# Patient Record
Sex: Female | Born: 1945 | Race: White | Hispanic: No | Smoking: Former smoker
Health system: Southern US, Community
[De-identification: ages and names within clinical notes are randomized; demographics above are authoritative.]

## PROBLEM LIST (undated history)

## (undated) DIAGNOSIS — J439 Emphysema, unspecified: Secondary | ICD-10-CM

## (undated) DIAGNOSIS — I2699 Other pulmonary embolism without acute cor pulmonale: Secondary | ICD-10-CM

## (undated) DIAGNOSIS — R319 Hematuria, unspecified: Secondary | ICD-10-CM

## (undated) DIAGNOSIS — M1990 Unspecified osteoarthritis, unspecified site: Secondary | ICD-10-CM

## (undated) DIAGNOSIS — I252 Old myocardial infarction: Secondary | ICD-10-CM

## (undated) DIAGNOSIS — Z972 Presence of dental prosthetic device (complete) (partial): Secondary | ICD-10-CM

## (undated) DIAGNOSIS — R0600 Dyspnea, unspecified: Secondary | ICD-10-CM

## (undated) DIAGNOSIS — I2510 Atherosclerotic heart disease of native coronary artery without angina pectoris: Secondary | ICD-10-CM

## (undated) DIAGNOSIS — N189 Chronic kidney disease, unspecified: Secondary | ICD-10-CM

## (undated) DIAGNOSIS — J410 Simple chronic bronchitis: Secondary | ICD-10-CM

## (undated) DIAGNOSIS — N200 Calculus of kidney: Secondary | ICD-10-CM

## (undated) DIAGNOSIS — R058 Other specified cough: Secondary | ICD-10-CM

## (undated) DIAGNOSIS — Z955 Presence of coronary angioplasty implant and graft: Secondary | ICD-10-CM

## (undated) DIAGNOSIS — G2581 Restless legs syndrome: Secondary | ICD-10-CM

## (undated) DIAGNOSIS — E119 Type 2 diabetes mellitus without complications: Secondary | ICD-10-CM

## (undated) DIAGNOSIS — K219 Gastro-esophageal reflux disease without esophagitis: Secondary | ICD-10-CM

## (undated) DIAGNOSIS — R0609 Other forms of dyspnea: Secondary | ICD-10-CM

## (undated) DIAGNOSIS — Z87442 Personal history of urinary calculi: Secondary | ICD-10-CM

## (undated) DIAGNOSIS — F0390 Unspecified dementia without behavioral disturbance: Secondary | ICD-10-CM

## (undated) DIAGNOSIS — Z8719 Personal history of other diseases of the digestive system: Secondary | ICD-10-CM

## (undated) DIAGNOSIS — E785 Hyperlipidemia, unspecified: Secondary | ICD-10-CM

## (undated) DIAGNOSIS — N201 Calculus of ureter: Secondary | ICD-10-CM

## (undated) DIAGNOSIS — R05 Cough: Secondary | ICD-10-CM

## (undated) DIAGNOSIS — Z9981 Dependence on supplemental oxygen: Secondary | ICD-10-CM

## (undated) HISTORY — DX: Gastro-esophageal reflux disease without esophagitis: K21.9

## (undated) HISTORY — PX: CHOLECYSTECTOMY: SHX55

## (undated) HISTORY — PX: THYROIDECTOMY, PARTIAL: SHX18

## (undated) HISTORY — DX: Hyperlipidemia, unspecified: E78.5

## (undated) HISTORY — PX: TOTAL ABDOMINAL HYSTERECTOMY W/ BILATERAL SALPINGOOPHORECTOMY: SHX83

## (undated) HISTORY — PX: CARDIAC CATHETERIZATION: SHX172

## (undated) HISTORY — PX: CARDIOVASCULAR STRESS TEST: SHX262

## (undated) HISTORY — DX: Atherosclerotic heart disease of native coronary artery without angina pectoris: I25.10

---

## 1993-07-16 HISTORY — PX: CORONARY ANGIOPLASTY WITH STENT PLACEMENT: SHX49

## 2001-11-11 ENCOUNTER — Ambulatory Visit (HOSPITAL_COMMUNITY): Admission: RE | Admit: 2001-11-11 | Discharge: 2001-11-11 | Payer: Self-pay | Admitting: Gastroenterology

## 2005-03-20 ENCOUNTER — Ambulatory Visit: Payer: Self-pay | Admitting: Cardiology

## 2005-05-21 ENCOUNTER — Ambulatory Visit: Payer: Self-pay | Admitting: Cardiology

## 2006-03-09 ENCOUNTER — Inpatient Hospital Stay (HOSPITAL_COMMUNITY): Admission: EM | Admit: 2006-03-09 | Discharge: 2006-03-11 | Payer: Self-pay | Admitting: Cardiology

## 2006-03-09 ENCOUNTER — Ambulatory Visit: Payer: Self-pay | Admitting: Cardiology

## 2006-03-19 ENCOUNTER — Ambulatory Visit: Payer: Self-pay | Admitting: Cardiology

## 2006-10-31 ENCOUNTER — Ambulatory Visit: Payer: Self-pay | Admitting: Cardiology

## 2007-10-20 ENCOUNTER — Ambulatory Visit: Payer: Self-pay | Admitting: Cardiology

## 2008-09-07 ENCOUNTER — Inpatient Hospital Stay (HOSPITAL_COMMUNITY): Admission: AD | Admit: 2008-09-07 | Discharge: 2008-09-09 | Payer: Self-pay | Admitting: Cardiology

## 2008-09-07 ENCOUNTER — Ambulatory Visit: Payer: Self-pay | Admitting: Internal Medicine

## 2008-11-04 DIAGNOSIS — M940 Chondrocostal junction syndrome [Tietze]: Secondary | ICD-10-CM | POA: Insufficient documentation

## 2008-11-04 DIAGNOSIS — K219 Gastro-esophageal reflux disease without esophagitis: Secondary | ICD-10-CM | POA: Insufficient documentation

## 2008-11-04 DIAGNOSIS — E785 Hyperlipidemia, unspecified: Secondary | ICD-10-CM

## 2008-11-04 DIAGNOSIS — J449 Chronic obstructive pulmonary disease, unspecified: Secondary | ICD-10-CM

## 2008-11-05 ENCOUNTER — Encounter: Payer: Self-pay | Admitting: Cardiology

## 2008-11-05 ENCOUNTER — Ambulatory Visit: Payer: Self-pay | Admitting: Cardiology

## 2008-11-05 DIAGNOSIS — I2510 Atherosclerotic heart disease of native coronary artery without angina pectoris: Secondary | ICD-10-CM | POA: Insufficient documentation

## 2008-11-15 ENCOUNTER — Telehealth: Payer: Self-pay | Admitting: Cardiology

## 2009-09-27 ENCOUNTER — Ambulatory Visit: Payer: Self-pay | Admitting: Cardiology

## 2009-09-27 DIAGNOSIS — F17200 Nicotine dependence, unspecified, uncomplicated: Secondary | ICD-10-CM

## 2009-09-30 ENCOUNTER — Telehealth: Payer: Self-pay | Admitting: Cardiology

## 2010-03-24 ENCOUNTER — Ambulatory Visit: Payer: Self-pay | Admitting: Cardiology

## 2010-05-08 ENCOUNTER — Ambulatory Visit: Payer: Self-pay | Admitting: Cardiology

## 2010-05-11 ENCOUNTER — Ambulatory Visit: Payer: Self-pay | Admitting: Internal Medicine

## 2010-05-11 DIAGNOSIS — R0989 Other specified symptoms and signs involving the circulatory and respiratory systems: Secondary | ICD-10-CM | POA: Insufficient documentation

## 2010-05-11 DIAGNOSIS — R0609 Other forms of dyspnea: Secondary | ICD-10-CM

## 2010-05-11 LAB — CONVERTED CEMR LAB
Albumin: 3.8 g/dL (ref 3.5–5.2)
Bilirubin, Direct: 0.1 mg/dL (ref 0.0–0.3)
Cholesterol: 173 mg/dL (ref 0–200)
HDL: 44.8 mg/dL (ref >39.00)
Total CHOL/HDL Ratio: 4
Total Protein: 6.6 g/dL (ref 6.0–8.3)
Triglycerides: 84 mg/dL (ref 0.0–149.0)

## 2010-05-12 ENCOUNTER — Telehealth (INDEPENDENT_AMBULATORY_CARE_PROVIDER_SITE_OTHER): Payer: Self-pay | Admitting: *Deleted

## 2010-05-16 LAB — CONVERTED CEMR LAB
BUN: 10 mg/dL (ref 6–23)
Basophils Absolute: 0 10*3/uL (ref 0.0–0.1)
CO2: 27 meq/L (ref 19–32)
Calcium: 9.6 mg/dL (ref 8.4–10.5)
Chloride: 102 meq/L (ref 96–112)
Creatinine, Ser: 0.8 mg/dL (ref 0.4–1.2)
Eosinophils Absolute: 0.1 10*3/uL (ref 0.0–0.7)
Glucose, Bld: 92 mg/dL (ref 70–99)
Lymphocytes Relative: 25.7 % (ref 12.0–46.0)
MCHC: 33.8 g/dL (ref 30.0–36.0)
MCV: 92.6 fL (ref 78.0–100.0)
Monocytes Absolute: 1 10*3/uL (ref 0.1–1.0)
Neutrophils Relative %: 62.8 % (ref 43.0–77.0)
Pro B Natriuretic peptide (BNP): 97.1 pg/mL (ref 0.0–100.0)
RDW: 14.2 % (ref 11.5–14.6)

## 2010-06-12 ENCOUNTER — Ambulatory Visit: Payer: Self-pay | Admitting: Internal Medicine

## 2010-06-28 ENCOUNTER — Ambulatory Visit: Payer: Self-pay | Admitting: Cardiology

## 2010-07-07 LAB — CONVERTED CEMR LAB
ALT: 15 units/L (ref 0–35)
Albumin: 3.7 g/dL (ref 3.5–5.2)
Cholesterol: 112 mg/dL (ref 0–200)
LDL Cholesterol: 54 mg/dL (ref 0–99)
Total Protein: 6.5 g/dL (ref 6.0–8.3)
Triglycerides: 105 mg/dL (ref 0.0–149.0)
VLDL: 21 mg/dL (ref 0.0–40.0)

## 2010-08-13 LAB — CONVERTED CEMR LAB
ALT: 16 units/L (ref 0–35)
ALT: 17 units/L (ref 0–35)
ALT: 17 units/L (ref 0–35)
AST: 20 units/L (ref 0–37)
AST: 22 units/L (ref 0–37)
BUN: 11 mg/dL (ref 6–23)
BUN: 6 mg/dL (ref 6–23)
Bilirubin, Direct: 0.1 mg/dL (ref 0.0–0.3)
Bilirubin, Direct: 0.1 mg/dL (ref 0.0–0.3)
CO2: 30 meq/L (ref 19–32)
Calcium: 9.2 mg/dL (ref 8.4–10.5)
Calcium: 9.2 mg/dL (ref 8.4–10.5)
Cholesterol: 114 mg/dL (ref 0–200)
Cholesterol: 125 mg/dL (ref 0–200)
Creatinine, Ser: 0.9 mg/dL (ref 0.4–1.2)
GFR calc non Af Amer: 66.86 mL/min (ref >60)
GFR calc non Af Amer: 67.05 mL/min (ref >60)
Glucose, Bld: 95 mg/dL (ref 70–99)
HDL: 35.8 mg/dL — ABNORMAL LOW (ref >39.0)
HDL: 38.7 mg/dL — ABNORMAL LOW (ref >39.00)
Potassium: 3.9 meq/L (ref 3.5–5.1)
Potassium: 4.2 meq/L (ref 3.5–5.1)
Sodium: 144 meq/L (ref 135–145)
Sodium: 146 meq/L — ABNORMAL HIGH (ref 135–145)
Total CHOL/HDL Ratio: 3.2
Total Protein: 6.4 g/dL (ref 6.0–8.3)
Total Protein: 6.6 g/dL (ref 6.0–8.3)
Total Protein: 6.8 g/dL (ref 6.0–8.3)
Triglycerides: 75 mg/dL (ref 0–149)
VLDL: 21.8 mg/dL (ref 0.0–40.0)
VLDL: 35.2 mg/dL (ref 0.0–40.0)

## 2010-08-17 NOTE — Assessment & Plan Note (Signed)
Summary: Pulmonary/ new pt eval > Hfa 50% p coaching    Visit Type:  Initial Consult Copy to:  Self Primary Provider/Referring Provider:  Arrie Aran MD  CC:  Dyspnea.  History of Present Illness: 65 Gilbert active smoker with doe x 2005  May 11, 2010  1st pulmonary office eval cc doe indolent onset doe progressive better with proaire or a shot of kenalog.  Also cough esp when lie down, thick white mucus, some worse in winter and with colds.  Pt denies any significant sore throat, dysphagia, itching, sneezing,  nasal congestion or excess secretions,  fever, chills, sweats, unintended wt loss, pleuritic or exertional cp, hempoptysis, fluctuation in activity tolerance,  orthopnea pnd or leg swelling Pt also denies any obvious fluctuation in symptoms with weather or environmental change or other alleviating or aggravating factors.       Current Medications (verified): 1)  Isosorbide Mononitrate Cr 120 Mg Xr24h-Tab (Isosorbide Mononitrate) .Marland Kitchen.. 1 Tab Once Daily 2)  Lipitor 40 Mg Tabs (Atorvastatin Calcium) .... Take 1 Tablet At Bedtime. 3)  Nitroglycerin 0.4 Mg Subl (Nitroglycerin) .... Place 1 Tablet Under Tongue As Directed 4)  Aspirin Ec 325 Mg Tbec (Aspirin) .... Take One Tablet By Mouth Daily 5)  Omeprazole 20 Mg Cpdr (Omeprazole) .... 2 Caps Two Times A Day 6)  Proair Hfa 108 (90 Base) Mcg/act Aers (Albuterol Sulfate) .... Use As Directed As Needed 7)  Hydrocodone-Acetaminophen 5-500 Mg Tabs (Hydrocodone-Acetaminophen) .Marland Kitchen.. 1 Every 6 Hrs As Needed  Allergies (verified): No Known Drug Allergies  Past History:  Past Medical History: CORONARY ATHEROSCLEROSIS, NATIVE VESSEL (ICD-414.01) HYPERLIPIDEMIA-MIXED (ICD-272.4) COSTOCHONDRITIS (ICD-733.6) COPD (ICD-496)     - HFA 50% May 11, 2010      - PFTs ordered >>> GERD (ICD-530.81)  Family History: Family History of Coronary Artery Disease:  Family History of CVA or Stroke:  Negative for respiratory diseases or atopy    Social History: Retired  Widowed  Current smoker since age 65- smokes 1/2 ppd. No ETOH  Review of Systems       The patient complains of shortness of breath with activity, shortness of breath at rest, productive cough, chest pain, abdominal pain, and sneezing.  The patient denies non-productive cough, coughing up blood, irregular heartbeats, acid heartburn, indigestion, loss of appetite, weight change, difficulty swallowing, sore throat, tooth/dental problems, headaches, nasal congestion/difficulty breathing through nose, itching, ear ache, anxiety, depression, hand/feet swelling, joint stiffness or pain, rash, change in color of mucus, and fever.    Vital Signs:  Patient profile:   65 year old female Weight:      145 pounds O2 Sat:      97 % on Room air Temp:     98.7 degrees F oral Pulse rate:   87 / minute BP sitting:   134 / 78  (left arm)  Vitals Entered By: Vernie Murders (May 11, 2010 9:13 AM)  O2 Flow:  Room air  Physical Exam  Additional Exam:  edentulous pale amb wf nad wt 144 > 145 May 11, 2010 HEENT mild turbinate edema.  Oropharynx no thrush or excess pnd or cobblestoning.  No JVD or cervical adenopathy. Mild accessory muscle hypertrophy. Trachea midline, nl thryroid. Chest was hyperinflated by percussion with diminished breath sounds and moderate increased exp time without wheeze. Hoover sign positive at mid inspiration. Regular rate and rhythm without murmur gallop or rub or increase P2 or edema.  Abd: no hsm, nl excursion. Ext warm without cyanosis or clubbing.  Sodium                    138 mEq/L                   135-145   Potassium                 4.5 mEq/L                   3.5-5.1   Chloride                  102 mEq/L                   96-112   Carbon Dioxide            27 mEq/L                    19-32   Glucose                   Vanessa mg/dL                    16-10   BUN                       10 mg/dL                    9-60   Creatinine                 0.8 mg/dL                   4.5-4.0   Calcium                   9.6 mg/dL                   9.8-11.9   GFR                       78.71 mL/min                >60  Tests: (2) CBC Platelet w/Diff (CBCD)   White Cell Count          9.5 K/uL                    4.5-10.5   Red Cell Count            5.10 Mil/uL                 3.87-5.11   Hemoglobin           [H]  16.0 g/dL                   14.7-82.9   Hematocrit           [H]  47.2 %                      36.0-46.0   MCV                       Vanessa.6 fl                     78.0-100.0   MCHC                      33.8  g/dL                   08.6-57.8   RDW                       14.2 %                      11.5-14.6   Platelet Count            184.0 K/uL                  150.0-400.0   Neutrophil %              62.8 %                      43.0-77.0   Lymphocyte %              25.7 %                      12.0-46.0   Monocyte %                10.0 %                      3.0-12.0   Eosinophils%              1.2 %                       0.0-5.0   Basophils %               0.3 %                       0.0-3.0   Neutrophill Absolute      5.9 K/uL                    1.4-7.7   Lymphocyte Absolute       2.4 K/uL                    0.7-4.0   Monocyte Absolute         1.0 K/uL                    0.1-1.0  Eosinophils, Absolute                             0.1 K/uL                    0.0-0.7   Basophils Absolute        0.0 K/uL                    0.0-0.1  Tests: (3) TSH (TSH)   FastTSH                   0.49 uIU/mL                 0.35-5.50  Tests: (4) B-Type Natiuretic Peptide (BNPR)  B-Type Natriuetic Peptide                             97.1 pg/mL                  0.0-100.0  CXR  Procedure date:  05/11/2010  Findings:      Emphysematous changes. No acute abnormalities.  Impression & Recommendations:  Problem # 1:  COPD (ICD-496) Moderate clinically with a sign response to b2 and saba    DDX of  difficult airways managment all start with A and   include Adherence, Ace Inhibitors, Acid Reflux, Active Sinus Disease, Alpha 1 Antitripsin deficiency, Anxiety masquerading as Airways dz,  ABPA,  allergy(esp in young), Aspiration (esp in elderly), Adverse effects of DPI,  Active smokers, plus one B  = Beta blocker use..   In this case Adherence is the biggest issue and starts with  inability to use HFA effectively and also  understand that SABA treats the symptoms but doesn't get to the underlying problem (inflammation).  I used  the ananology of putting steroid cream on a rash to help explain the meaning of topical therapy and the need to get the drug to the target tissue.    I spent extra time with the patient today explaining optimal mdi  technique.  This improved from  25-50%  Active smoking the other obvious issue  ? Acid reflux also an issue: rx empirically  Each maintenance medication was reviewed in detail including most importantly the difference between maintenance prns and under what circumstances the prns are to be used.  In addition, these two groups (for which the patient should keep up with refills) were distinguished from a third group :  meds that are used only short term with the intent to complete a course of therapy and then not refill them.  The med list was then fully reconciled and reorganized to reflect this important distinction.   Problem # 2:  TOBACCO USER (ICD-305.1)  I took this opportunity to educate the patient regarding the consequences of smoking in airway disorders based on all the data we have from the multiple national lung health studies indicating that smoking cessation, not choice of inhalers or physicians, is the most important aspect of care.    I emphasized that although we never turn away smokers from the pulmonary clinic, we do ask that they understand that the recommendations that were made won't work nearly as well in the presence of continued cigarette exposure and we may reach a point where we can't  help the patient if he/she can't quit smoking.    Orders: New Patient Level V ([address])  Medications Added to Medication List This Visit: 1)  Omeprazole 20 Mg Cpdr (Omeprazole) .... Take one 30-60 min before first and last meals of the day 2)  Pepcid 20 Mg Tabs (Famotidine) .... Take one by mouth at bedtime 3)  Symbicort 160-4.5 Mcg/act Aero (Budesonide-formoterol fumarate) .... 2 puffs first thing  in am and 2 puffs again in pm about 12 hours later 4)  Hydrocodone-acetaminophen 5-500 Mg Tabs (Hydrocodone-acetaminophen) .Marland Kitchen.. 1 every 6 hrs as needed 5)  Tramadol Hcl 50 Mg Tabs (Tramadol hcl) .... One to two by mouth every 4-6 hours  Other Orders: T-2 View CXR (71020TC) TLB-BMP (Basic Metabolic Panel-BMET) (80048-METABOL) TLB-CBC Platelet - w/Differential (85025-CBCD) TLB-TSH (Thyroid Stimulating Hormone) (84443-TSH) TLB-BNP (B-Natriuretic Peptide) (83880-BNPR)  Patient Instructions: 1)  symbicort 160 2 puffs first thing  in am and 2 puffs again in pm about 12 hours later  2)  Take mucienx dm 2 every 12 hours and add tramadol 50 mg up to every 4 hours to suppress the urge to cough. Swallowing water or using ice chips/non mint and menthol containing candies (  such as lifesavers or sugarless jolly ranchers) are also effective.  3)  Work on inhaler technique:  relax and blow all the way out then take a nice smooth deep breath back in, triggering the inhaler at same time you start breathing in  4)  change omeprazole 20 mg Take one 30-60 min before first and last meals of the day  5)  Pepcid 20 mg one at bedtime  6)  GERD (REFLUX)  is a common cause of respiratory symptoms. It commonly presents without heartburn and can be treated with medication, but also with lifestyle changes including avoidance of late meals, excessive alcohol, smoking cessation, and avoid fatty foods, chocolate, peppermint, colas, red wine, and acidic juices such as orange juice. NO MINT OR MENTHOL PRODUCTS SO NO COUGH DROPS  7)   USE SUGARLESS CANDY INSTEAD (jolley ranchers)  8)  NO OIL BASED VITAMINS  9)  Please schedule a follow-up appointment in 4 weeks, sooner if needed with PFT's Prescriptions: TRAMADOL HCL 50 MG  TABS (TRAMADOL HCL) One to two by mouth every 4-6 hours  #40 x 0   Entered and Authorized by:   Nyoka Cowden MD   Signed by:   Nyoka Cowden MD on 05/11/2010   Method used:   Electronically to        [facility] [address] (retail)       [address]       [place], [place]  [address]       Ph: [phone] or [phone]       Fax: [phone]   RxID:   [id number] PEPCID 20 MG TABS (FAMOTIDINE) take one by mouth at bedtime  #34 x 11   Entered and Authorized by:   Nyoka Cowden MD   Signed by:   Nyoka Cowden MD on 05/11/2010   Method used:   Electronically to        [facility] [address] (retail)       [address]       [place], [place]  [address]       Ph: [phone] or [phone]       Fax: [phone]   RxID:   [id number]

## 2010-08-17 NOTE — Assessment & Plan Note (Signed)
Summary: 6 mo f/u ./cy   Visit Type:  6 mo f/u Primary Mahaley Schwering:  Arrie Aran MD  CC:  chest discomfort thinks due to her lung problems...sob....fatigue...denies any edema.  History of Present Illness: Mrs. Orrison returns for evaluation and management of coronary artery disease, tobacco use, and mixed hyperlipidemia.  She complains of constant fatigue and sleeping poorly. In fact, she has not slept well since husband died.  She's had no angina or ischemic symptoms other than her dyspnea on exertion. Sometimes she has trouble getting up phlegm or gets a tightness in her chest until it clears. Unfortunately, she continues to smoke quite heavily.  She would like to stop her Zetia.  Current Medications (verified): 1)  Isosorbide Mononitrate Cr 120 Mg Xr24h-Tab (Isosorbide Mononitrate) .Marland Kitchen.. 1 Tab Once Daily 2)  Lipitor 20 Mg Tabs (Atorvastatin Calcium) .Marland Kitchen.. 1 Tab At Bedtime 3)  Nitroglycerin 0.4 Mg Subl (Nitroglycerin) .... Place 1 Tablet Under Tongue As Directed 4)  Zetia 10 Mg Tabs (Ezetimibe) .Marland Kitchen.. 1 Tab Once Daily 5)  Aspirin Ec 325 Mg Tbec (Aspirin) .... Take One Tablet By Mouth Daily 6)  Omeprazole 20 Mg Cpdr (Omeprazole) .... 2 Caps Two Times A Day 7)  Proair Hfa 108 (90 Base) Mcg/act Aers (Albuterol Sulfate) .... Use As Directed As Needed  Allergies (verified): No Known Drug Allergies  Past History:  Past Medical History: Last updated: 11/05/2008 CORONARY ATHEROSCLEROSIS, NATIVE VESSEL (ICD-414.01) HYPERLIPIDEMIA-MIXED (ICD-272.4) COSTOCHONDRITIS (ICD-733.6) COPD (ICD-496) GERD (ICD-530.81)  Past Surgical History: Last updated: 11/04/2008 CABG - 2007 Thyroid surgery  Family History: Last updated: 11/04/2008 Family History of Coronary Artery Disease:  Family History of CVA or Stroke:   Social History: Last updated: 11/04/2008 Retired  Widowed  Tobacco Use - Yes.  Alcohol Use - no  Risk Factors: Smoking Status: current (11/04/2008)  Review of Systems    negative other than history of present illness  Vital Signs:  Patient profile:   65 year old female Height:      67 inches Weight:      144.4 pounds BMI:     22.70 Pulse rate:   71 / minute Pulse rhythm:   irregular BP sitting:   122 / 70  (left arm) Cuff size:   large  Vitals Entered By: Danielle Rankin, CMA (March 24, 2010 10:41 AM)  Physical Exam  General:  no acute distress, appears older than stated age Head:  normocephalic and atraumatic Eyes:  PERRLA/EOM intact; conjunctiva and lids normal. Neck:  Neck supple, no JVD. No masses, thyromegaly or abnormal cervical nodes. Lungs:  decreased breath sounds throughout, rhonchi Heart:  PMI nondisplaced, soft S1-S2, regular rate and rhythm, carotids full without bruits Abdomen:  Bowel sounds positive; abdomen soft and non-tender without masses, organomegaly, or hernias noted. No hepatosplenomegaly. Msk:  decreased ROM.   Pulses:  pulses normal in all 4 extremities Extremities:  No clubbing or cyanosis. Neurologic:  Alert and oriented x 3. Skin:  Intact without lesions or rashes. Psych:  depressed affect.     EKG  Procedure date:  03/24/2010  Findings:      normal sinus rhythm, nonspecific ST segment changes, stable  Impression & Recommendations:  Problem # 1:  CORONARY ATHEROSCLEROSIS, NATIVE VESSEL (ICD-414.01)  Her updated medication list for this problem includes:    Isosorbide Mononitrate Cr 120 Mg Xr24h-tab (Isosorbide mononitrate) .Marland Kitchen... 1 tab once daily    Nitroglycerin 0.4 Mg Subl (Nitroglycerin) .Marland Kitchen... Place 1 tablet under tongue as directed    Aspirin Ec  325 Mg Tbec (Aspirin) .Marland Kitchen... Take one tablet by mouth daily  Orders: EKG w/ Interpretation (93000)  Problem # 2:  HYPERLIPIDEMIA-MIXED (ICD-272.4) I have discontinued the Zetia. and doubled Lipitor. Followup labs in 6 weeks. The following medications were removed from the medication list:    Zetia 10 Mg Tabs (Ezetimibe) .Marland Kitchen... 1 tab once daily Her updated  medication list for this problem includes:    Lipitor 40 Mg Tabs (Atorvastatin calcium) .Marland Kitchen... Take 1 tablet at bedtime.  Problem # 3:  TOBACCO USER (ICD-305.1) Assessment: Unchanged  Clinical Reports Reviewed:  CXR:  03/10/2006: CXR Results:   Clinical Data:  65 year old female, chest pain, unstable angina,   COPD.   CHEST - 2 VIEW:   Findings:  Hyperinflation of the chest is evident with attenuation of   the vasculature consistent with COPD.  Normal heart size.  No acute   CHF, pneumonia, edema, effusion, or pneumothorax.   IMPRESSION:   COPD changes without acute chest process.    Read By:  Sigurd Sos.,  M.D.  Cardiac Cath:  03/11/2006: Cardiac Cath Findings:   IMPRESSION:  The patient's sustained what appeared to be a noncardiac in  etiology.  She needs to stop smoking.  Her chest pain may be related to some  bronchitis or COPD.  She seems well enough to go home later today.  As long  as her groin heals well, she will follow up with Dr. Daleen Squibb.       ______________________________  Noralyn Pick. 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 No evidence of ischemia. Ejection fraction 55%.  11/26/1995:  Persantine Technetium 39M Cardiolite myocardial perfusion study revealing a matched inferior lateral defect, most likely from the patient's prior circumflex infarct, with preserved wall motion and ejection fraction. The inferior wall is well perfused, suggesting patency of the RCA stent. Based on this study, the patient is at low risk for an endovascular procedure.   Patient Instructions: 1)  Your physician recommends that you schedule a follow-up appointment in: 1 year with Dr. Daleen Squibb 2)  Your physician has recommended you make the following change in your medication: STOP Zetia 3)  Your physician discussed the hazards of tobacco use.  Tobacco use cessation is recommended and techniques and options to help you quit were  discussed. 4)  Your physician recommends that you return for a FASTING lipid profile & liver in 6 weeks Prescriptions: LIPITOR 40 MG TABS (ATORVASTATIN CALCIUM) Take 1 tablet at bedtime.  #30 x 6   Entered by:   Lisabeth Devoid RN   Authorized by:   Gaylord Shih, MD, [facility]   Signed by:   Lisabeth Devoid RN on 03/24/2010   Method used:   Electronically to        [facility] (retail)       [address]       [place], [place]  [address]       Ph: [phone] or [phone]       Fax: [phone]   RxID:   [id number]

## 2010-08-17 NOTE — Progress Notes (Signed)
Summary: returning call   Phone Note Call from Patient Call back at Home Phone [phone]   Caller: Patient Reason for Call: Talk to Nurse Summary of Call: returning call, please call before 3 Initial call taken by: Migdalia Dk,  September 30, 2009 11:23 AM  Follow-up for Phone Call        Phone Call Completed Follow-up by: Scherrie Bateman, LPN,  September 30, 2009 12:14 PM

## 2010-08-17 NOTE — Progress Notes (Signed)
Summary: speak to nurse about labs/cxr results  Phone Note Call from Patient Call back at [phone]   Caller: granddaughter//amy hoffman` Call For: wert Summary of Call: Wants to speak with nurse in ref to her grandmother's x-ray results. Initial call taken by: Darletta Moll,  May 12, 2010 1:27 PM  Follow-up for Phone Call        called spoke with pt's granddaughter amy who stated that pt was unable to remember any of her lab/cxr results.  informed amy that per MW's appends, pt's labs and cxr looked normal and no change in therapy needed.  amy verbalized her understanding. Follow-up by: Boone Master CNA/MA,  May 12, 2010 4:15 PM

## 2010-08-17 NOTE — Assessment & Plan Note (Signed)
Summary: CAD/ANAS   Visit Type:  1 yr f/u Primary Provider:  Arrie Aran MD  CC:  sob today....denies any cp or edema.  History of Present Illness: [name] comes in today for evaluation and management of her coronary artery disease. She had nonobstructive disease by cardiac catheterization in 2007. 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  She would like to have her blood work checked today.  She continues to smoke. She has COPD.  She seems to be compliant with her medications.  She has exertional angina which is fairly infrequent. She takes nitroglycerin occasionally. She denies any nocturnal or rest pain. Denies orthopnea, PND or peripheral edema. She's had no syncope  Current Medications (verified): 1)  Isosorbide Mononitrate Cr 120 Mg Xr24h-Tab (Isosorbide Mononitrate) .Marland Kitchen.. 1 Tab Once Daily 2)  Lipitor 20 Mg Tabs (Atorvastatin Calcium) .Marland Kitchen.. 1 Tab At Bedtime 3)  Nitroglycerin 0.4 Mg Subl (Nitroglycerin) .... Place 1 Tablet Under Tongue As Directed 4)  Zetia 10 Mg Tabs (Ezetimibe) .Marland Kitchen.. 1 Tab Once Daily 5)  Aspirin Ec 325 Mg Tbec (Aspirin) .... Take One Tablet By Mouth Daily 6)  Omeprazole 20 Mg Cpdr (Omeprazole) .... 2 Caps Two Times A Day  Allergies (verified): No Known Drug Allergies  Past History:  Past Medical History: Last updated: 11/05/2008 CORONARY ATHEROSCLEROSIS, NATIVE VESSEL (ICD-414.01) HYPERLIPIDEMIA-MIXED (ICD-272.4) COSTOCHONDRITIS (ICD-733.6) COPD (ICD-496) GERD (ICD-530.81)  Past Surgical History: Last updated: 11/04/2008 CABG - 2007 Thyroid surgery  Family History: Last updated: 11/04/2008 Family History of Coronary Artery Disease:  Family History of CVA or Stroke:   Social History: Last updated: 11/04/2008 Retired  Widowed  Tobacco Use - Yes.  Alcohol Use - no  Risk Factors: Smoking Status: current (11/04/2008)  Review of Systems       negative other than history of present illness  Vital Signs:  Patient profile:    65 year old female Height:      67 inches Weight:      152 pounds BMI:     23.89 Pulse rate:   79 / minute Pulse rhythm:   irregular BP sitting:   118 / 70  (left arm) Cuff size:   large  Vitals Entered By: Danielle Rankin, CMA (September 27, 2009 12:08 PM)  Physical Exam  General:  looks older than stated age, pleasant Head:  normocephalic and atraumatic Eyes:  PERRLA/EOM intact; conjunctiva and lids normal. Neck:  Neck supple, no JVD. No masses, thyromegaly or abnormal cervical nodes. Chest Zachariah Pavek:  no deformities or breast masses noted Lungs:  decreased breath sounds throughout Heart:  Non-displaced PMI, chest non-tender; regular rate and rhythm, S1, S2 without murmurs, rubs or gallops. Carotid upstroke normal, no bruit. Normal abdominal aortic size, no bruits. Femorals normal pulses, no bruits. Pedals normal pulses. No edema, no varicosities. Abdomen:  Bowel sounds positive; abdomen soft and non-tender without masses, organomegaly, or hernias noted. No hepatosplenomegaly. Msk:  Back normal, normal gait. Muscle strength and tone normal. Pulses:  pulses normal in all 4 extremities Extremities:  No clubbing or cyanosis. Neurologic:  Alert and oriented x 3. Skin:  Intact without lesions or rashes. Psych:  Normal affect.   Impression & Recommendations:  Problem # 1:  CORONARY ATHEROSCLEROSIS, NATIVE VESSEL (ICD-414.01) She has stable angina. Nitroglycerin was removed. We'll check blood work including lipids. Goal LDL less than 70 Her updated medication list for this problem includes:    Isosorbide Mononitrate Cr 120 Mg Xr24h-tab (Isosorbide mononitrate) .Marland Kitchen... 1 tab once daily    Nitroglycerin 0.4  Mg Subl (Nitroglycerin) .Marland Kitchen... Place 1 tablet under tongue as directed    Aspirin Ec 325 Mg Tbec (Aspirin) .Marland Kitchen... Take one tablet by mouth daily  Orders: TLB-BMP (Basic Metabolic Panel-BMET) (80048-METABOL) EKG w/ Interpretation (93000)  Problem # 2:  HYPERLIPIDEMIA-MIXED (ICD-272.4)  Her  updated medication list for this problem includes:    Lipitor 20 Mg Tabs (Atorvastatin calcium) .Marland Kitchen... 1 tab at bedtime    Zetia 10 Mg Tabs (Ezetimibe) .Marland Kitchen... 1 tab once daily  Orders: TLB-Lipid Panel (80061-LIPID)  Problem # 3:  TOBACCO USER (ICD-305.1) Assessment: Unchanged counseled to quit  Other Orders: TLB-Hepatic/Liver Function Pnl (80076-HEPATIC)  Patient Instructions: 1)  Your physician recommends that you schedule a follow-up appointment in: 6 MONTHS WITH DR Takela Varden 2)  Your physician recommends that you continue on your current medications as directed. Please refer to the Current Medication list given to you today. 3)  Your physician recommends that you return for lab work ZO:XWRUE BMET LIPID LIVER

## 2010-08-17 NOTE — Miscellaneous (Signed)
Summary: Orders Update pft charges  Clinical Lists Changes  Orders: Added new Service order of Carbon Monoxide diffusing w/capacity (94720) - Signed Added new Service order of Lung Volumes (94240) - Signed Added new Service order of Spirometry (Pre & Post) (94060) - Signed 

## 2010-08-17 NOTE — Assessment & Plan Note (Signed)
Summary: Pulmonary/ ext f/u with hfa 75%   Copy to:  Self Primary Provider/Referring Provider:  Arrie Aran MD  CC:  Dyspnea- some better.  History of Present Illness: 22 yowf active smoker with doe x 2005  May 11, 2010  1st pulmonary office eval cc doe indolent onset doe progressive better with proaire or a shot of kenalog.  Also cough esp when lie down, thick white mucus, some worse in winter and with colds rec. symbicort 160 2 puffs first thing  in am and 2 puffs again in pm about 12 hours later  Take mucinex  dm 2 every 12 hours and add tramadol 50 mg up to every 4 hours prne.  Work on inhaler technique:n  change omeprazole 20 mg Take one 30-60 min before first and last meals of the day  Pepcid 20 mg one at bedtime  GERD (REFLUX)  diet  June 12, 2010 ov sob better, cough minimally improved, still smoking and not sure she'll be able to afford rx.  Pt denies any significant sore throat, dysphagia, itching, sneezing,  nasal congestion or excess secretions,  fever, chills, sweats, unintended wt loss, pleuritic or exertional cp, hempoptysis, change in activity tolerance  orthopnea pnd or leg swelling Pt also denies any obvious fluctuation in symptoms with weather or environmental change or other alleviating or aggravating factors.       Current Medications (verified): 1)  Isosorbide Mononitrate Cr 120 Mg Xr24h-Tab (Isosorbide Mononitrate) .Marland Kitchen.. 1 Tab Once Daily 2)  Lipitor 80 Mg Tabs (Atorvastatin Calcium) .... Take 1 Tablet Everyday At Bedtime. 3)  Aspirin Ec 325 Mg Tbec (Aspirin) .... Take One Tablet By Mouth Daily 4)  Omeprazole 20 Mg Cpdr (Omeprazole) .... Take One 30-60 Min Before First and Last Meals of The Day 5)  Pepcid 20 Mg Tabs (Famotidine) .... Take One By Mouth At Bedtime 6)  Symbicort 160-4.5 Mcg/act  Aero (Budesonide-Formoterol Fumarate) .... 2 Puffs First Thing  in Am and 2 Puffs Again in Pm About 12 Hours Later 7)  Proair Hfa 108 (90 Base) Mcg/act Aers (Albuterol  Sulfate) .... Use As Directed As Needed 8)  Nitroglycerin 0.4 Mg Subl (Nitroglycerin) .... Place 1 Tablet Under Tongue As Directed 9)  Tramadol Hcl 50 Mg  Tabs (Tramadol Hcl) .... One To Two By Mouth Every 4-6 Hours  Allergies (verified): No Known Drug Allergies  Past History:  Past Medical History: CORONARY ATHEROSCLEROSIS, NATIVE VESSEL (ICD-414.01) HYPERLIPIDEMIA-MIXED (ICD-272.4) COSTOCHONDRITIS (ICD-733.6) COPD (ICD-496)     - HFA 50% May 11, 2010 >>  75% June 12, 2010      - PFTs June 12, 2010 FEV1  1.59 (66%) ratio 65 ,  15% after B2, 69% DLCO      - Pneumovax June 12, 2010  GERD (ICD-530.81)  Vital Signs:  Patient profile:   65 year old female Weight:      143 pounds O2 Sat:      96 % on Room air Temp:     98.1 degrees F oral Pulse rate:   80 / minute BP sitting:   124 / 70  (left arm)  Vitals Entered By: Vernie Murders (June 12, 2010 11:58 AM)  O2 Flow:  Room air  Physical Exam  Additional Exam:  edentulous pale amb wf nad wt 144 > 145 May 11, 2010 > 143 June 12, 2010  HEENT mild turbinate edema.  Oropharynx no thrush or excess pnd or cobblestoning.  No JVD or cervical adenopathy. Mild accessory muscle hypertrophy. Trachea midline,  nl thryroid. Chest was hyperinflated by percussion with diminished breath sounds and moderate increased exp time without wheeze. Hoover sign positive at mid inspiration. Regular rate and rhythm without murmur gallop or rub or increase P2 or edema.  Abd: no hsm, nl excursion. Ext warm without cyanosis or clubbing.     Impression & Recommendations:  Problem # 1:  COPD (ICD-496)  Moderate clinically with a sign response to b2 and saba > GOLD II by pft's June 12, 2010   DDX of  difficult airways managment all start with A and  include Adherence, Ace Inhibitors, Acid Reflux, Active Sinus Disease, Alpha 1 Antitripsin deficiency, Anxiety masquerading as Airways dz,  ABPA,  allergy(esp in young), Aspiration (esp  in elderly), Adverse effects of DPI,  Active smokers, plus one B  = Beta blocker use..   In this case Adherence is the biggest issue and starts with  inability to use HFA effectively and also  understand that SABA treats the symptoms but doesn't get to the underlying problem (inflammation).  I used  the ananology of putting steroid cream on a rash to help explain the meaning of topical therapy and the need to get the drug to the target tissue.    I spent extra time with the patient today explaining optimal mdi  technique.  This improved from  50-75%  Active smoking the other obvious issue  ? Acid reflux also an issue: rx empirically  Each maintenance medication was reviewed in detail including most importantly the difference between maintenance prns and under what circumstances the prns are to be used.  In addition, these two groups (for which the patient should keep up with refills) were distinguished from a third group :  meds that are used only short term with the intent to complete a course of therapy and then not refill them.  The med list was then fully reconciled and reorganized to reflect this important distinction.   Problem # 2:  TOBACCO USER (ICD-305.1)  I took this opportunity to educate the patient regarding the consequences of smoking in airway disorders based on all the data we have from the multiple national lung health studies indicating that smoking cessation, not choice of inhalers or physicians, is the most important aspect of care.    I emphasized that although we never turn away smokers from the pulmonary clinic, we do ask that they understand that the recommendations that were made won't work nearly as well in the presence of continued cigarette exposure and we may reach a point where we can't help the patient if he/she can't quit smoking.    I reviewed the Flethcher curve with her that basically says if you quit smoking when your best day FEV1 is still well preserved it is  highly unlikely you will progress to severe disease and informed the patient there was no medication on the market that has proven to change the curve or the likelihood of progression   Medications Added to Medication List This Visit: 1)  Omeprazole 20 Mg Cpdr (Omeprazole) .... Take one 30-60 min before first and last meals of the day 2)  Symbicort 160-4.5 Mcg/act Aero (Budesonide-formoterol fumarate) .... 2 puffs first thing  in am and 2 puffs again in pm about 12 hours later  Other Orders: Pneumococcal Vaccine (16109) Admin 1st Vaccine (60454) Est. Patient Level IV (09811)  Patient Instructions: 1)   Stop smoking now - your lung function in mild to moderately effectly by smoking, not severe 2)  Pnuemonia shot at age 14  given today, no further pneumonia shots recommended Prescriptions: SYMBICORT 160-4.5 MCG/ACT  AERO (BUDESONIDE-FORMOTEROL FUMARATE) 2 puffs first thing  in am and 2 puffs again in pm about 12 hours later  #1 x 11   Entered and Authorized by:   Nyoka Cowden MD   Signed by:   Nyoka Cowden MD on 06/12/2010   Method used:   Electronically to        [facility] [address] (retail)       [address]       [place], [place]  [address]       Ph: [phone] or [phone]       Fax: [phone]   RxID:   [id number]    Immunizations Administered:  Pneumonia Vaccine:    Vaccine Type: Pneumovax (Medicare)    Site: left deltoid    Mfr: Merck    Dose: 0.5 ml    Route: IM    Given by: Renold Genta RCP, LPN    Exp. Date: 04/13    Lot #: [id number]

## 2010-10-23 ENCOUNTER — Telehealth: Payer: Self-pay | Admitting: Cardiology

## 2010-10-23 NOTE — Telephone Encounter (Signed)
Patient states the past  Friday evening after eating supper, she started having pain on right side of face, tongue, neck, jaw and right arm. Patient took a SL NTG. Within a few minutes the pain went away. Yesterday afternoon she was drinking a 7UP. She felt like something was building up in her chest. She started to have chest pain score of '"10". Again pt. Took a SL NTG. Within a minute pain was relieved. Pt. States she is fine now. She would like to be seen in the office soon.  Dr. Daleen Squibb notified. MD states Pt. May have stomach reflux. MD recommends for pt. To continue taken NTG SL as needed. Pt. Was explained the difference between true CP and reflux. Pt. States takes medication for reflux. Pt. Verbalized understanding. Pt was transferred to schedulers for appointment with Dr. Daleen Squibb.

## 2010-10-31 LAB — CARDIAC PANEL(CRET KIN+CKTOT+MB+TROPI)
CK, MB: 1.6 ng/mL (ref 0.3–4.0)
Relative Index: INVALID (ref 0.0–2.5)
Relative Index: INVALID (ref 0.0–2.5)
Total CK: 60 U/L (ref 7–177)
Troponin I: <0.01 ng/mL (ref 0.00–0.06)

## 2010-11-15 ENCOUNTER — Other Ambulatory Visit: Payer: Self-pay | Admitting: Cardiology

## 2010-11-16 ENCOUNTER — Encounter: Payer: Self-pay | Admitting: Cardiology

## 2010-11-22 ENCOUNTER — Encounter: Payer: Self-pay | Admitting: Cardiology

## 2010-11-23 ENCOUNTER — Ambulatory Visit (INDEPENDENT_AMBULATORY_CARE_PROVIDER_SITE_OTHER): Payer: Medicare PPO | Admitting: Cardiology

## 2010-11-23 ENCOUNTER — Encounter: Payer: Self-pay | Admitting: Cardiology

## 2010-11-23 VITALS — BP 122/68 | HR 74 | Resp 18 | Ht 67.0 in | Wt 137.1 lb

## 2010-11-23 DIAGNOSIS — I2510 Atherosclerotic heart disease of native coronary artery without angina pectoris: Secondary | ICD-10-CM

## 2010-11-23 DIAGNOSIS — F17200 Nicotine dependence, unspecified, uncomplicated: Secondary | ICD-10-CM

## 2010-11-23 MED ORDER — NITROGLYCERIN 0.4 MG SL SUBL: 0.4000 mg | SUBLINGUAL_TABLET | SUBLINGUAL | Status: DC | PRN

## 2010-11-23 NOTE — Assessment & Plan Note (Signed)
Stable, SL NTG renewed, urged to quit smoking.

## 2010-11-23 NOTE — Progress Notes (Signed)
   Patient ID: Linden Mikes, female    DOB: [birth year], 65 y.o.   MRN: [id number]  HPI Mrs Zwiefelhofer comes in today for E and M of her CAD. She has significant DOE but continues to smoke. We have encouraged her to quit numerous times. She is recovering from bronchitis and pneumonia. She has taken NTG once for right jaw pain. No suggestion of progressive angina.  EKG today is normal.   Review of Systems  All other systems reviewed and are negative.      Physical Exam  Nursing note and vitals reviewed. Constitutional: She is oriented to person, place, and time. She appears well-developed. No distress.       Chronically ill appearing  HENT:  Head: Normocephalic and atraumatic.  Eyes: EOM are normal.  Neck: No JVD present. No tracheal deviation present. No thyromegaly present.  Cardiovascular: Normal rate, regular rhythm, S1 normal, S2 normal and intact distal pulses.  Exam reveals no distant heart sounds.     No systolic murmur is present  Pulmonary/Chest: Effort normal. She has no wheezes. She has no rales.       Decreased breath sound throughout.  Abdominal: Soft. Bowel sounds are normal.       No bruit  Musculoskeletal: Normal range of motion. She exhibits no edema.  Neurological: She is oriented to person, place, and time.  Skin: Skin is warm and dry.  Psychiatric: She has a normal mood and affect.

## 2010-11-23 NOTE — Patient Instructions (Signed)
Your physician recommends that you schedule a follow-up appointment in: 1 year with Dr. Wall  

## 2010-11-28 NOTE — H&P (Signed)
NAMENYEMAH, WATTON                ACCOUNT NO.:  [id number]   MEDICAL RECORD NO.:  [id number]          PATIENT TYPE:  INP   LOCATION:  2032                         FACILITY:  [facility]   PHYSICIAN:  Bevelyn Buckles. Bensimhon, MDDATE OF BIRTH:  13-Oct-1945   DATE OF ADMISSION:  09/07/2008  DATE OF DISCHARGE:                              HISTORY & PHYSICAL   CHIEF COMPLAINT:  Chest pain.   PRIMARY CARDIOLOGIST:  Jesse Sans. Wall, MD, FACC   HISTORY OF PRESENT ILLNESS:  A 65 year old with past medical history  significant for coronary artery disease, status post stent in 1995;  status post cath in 2007, with nonobstructive coronary artery disease  and patent stent in 2007; COPD, current smoker who presented to [facility] complaining of chest pain.  The patient was then transferred to  [facility].  The patient described chest pain as sharp,  constant.  When it started, 7/10.  By now, is 4/10.  It started on the  morning of admission.  It is on her right side from the costal margin  area radiating to the right back, worse with movement and cough and  occasionally with deep breath.  She has chronic dyspnea that has been  the same over the last month.  This morning, she had acute shortness of  breath, worst than everyday.  She has had chronic cough that is not  getting worse and denies any fevers, chills.   REVIEW OF SYSTEMS:  Negative except as per HPI.   ALLERGIES:  CIPRO, she gets a rash.   PAST MEDICAL HISTORY:  1. MI and coronary artery disease, status post stent in 1995.      Nonobstructive coronary artery disease, status post bypass in 2007,      after passing a stent over the RCA, ejection fraction 60%.  2. Hyperlipidemia.  3. GERD.  4. Tobacco abuse.  5. COPD.  6. History of thyroid surgery.   FAMILY HISTORY:  Father died of a stroke at age 23 and mother died of an  MI at age 58.   SOCIAL HISTORY:  She is a widow.  She has 2 children.  She is retired.  She used to  work at [employer].  She denies alcohol.  She is  currently a smoker.  She smokes 1 pack per day for the last 50 years.   MEDICATIONS:  1. Isosorbide 120 p.o. daily.  2. Albuterol inhaler t.i.d.  3. Famotidine 40 p.o. daily.  4. Lipitor 20 p.o. daily.  5. Zetia 10 p.o. daily.  6  Aspirin 325 p.o. daily.   PHYSICAL EXAMINATION:  VITAL SIGNS:  Blood pressure 142/74, pulse 78,  respirations 20, temperature 96.8, and sats 97 on 2 L.  GENERAL:  The patient is in no acute distress, speaking in full  sentences.  HEENT:  No JVD.  No bruits.  CARDIOVASCULAR:  S1, S2.  Normal regular rhythm and rate.  No murmur.  No gallop.  LUNGS:  Bilateral movement.  Bilateral mild wheezing.  No crackles.  Mild rhonchi.  ABDOMEN:  Bowel sounds positive.  Soft,  nontender, and nondistended.  No  rigidity.  EXTREMITIES:  Pulse positive.  No edema.  NEUROLOGIC:  Alert and oriented x3.  Cranial nerves II through XII  intact.  Sensation grossly intact.  Motor strength 5/5 throughout.   ADMISSION LABORATORY DATA:  White blood cells 8.3, hemoglobin 15.3,  platelets 151, hematocrit 44.4, ANC of 55.0.  Sodium 142, potassium 4.0,  chloride 107, bicarb 26, BUN 10, creatinine 0.97, glucose 127,  creatinine 9.6, albumin 3.7.  Troponin 0.01, myoglobulin 29, CK-MB 59.  Albumin 3.5, alkaline phosphatase 116, AST 23, ALT 11, bilirubin 0.3.  INR 1.0, PTT 10.3.  EKG, no ST changes.   PROBLEMS:  1. Chest pain.  Right-sided chest pain in a 65 year old with past      medical history of coronary artery disease, status post cath in      2007, showed nonobstructive coronary artery disease, with patent      stent who presented with Atypical chest pain, worst with movement      and cough.  EKG without ST changes.  First set of cardiac enzymes      negative.  This is likely musculoskeletal pain.  She had a CT angio      at [facility] that was negative for pulmonary embolism and aortic      dissection.  We are going to  continue with set of cardiac enzymes      and serial EKG.  We are going to give her Vicodin for pain and      guaifenesin to control her cough.  2. Chronic obstructive pulmonary disease.  Her shortness of breath is      likely secondary to chronic obstructive pulmonary disease.  She has      bilateral wheezes on on lung exam.  We are going to start      prednisone 60 mg p.o. daily, albuterol, and ipratropium nebulizer      treatment.  Guaifenesin for cough.  We are going hold at this      moment for any antibiotics.  She does not have any worsening cough.      No fever.  No leukocytosis.  3. Tobacco abuse.  We are going to start nicotine patch.  I did      counsel her.  We are going to get also a smoking cessation      counseling.  4. Deep venous thrombosis prophylaxis.  We are going to start heparin      subcutaneously t.i.d.      Hartley Barefoot, MD  Electronically Signed      Bevelyn Buckles. Bensimhon, MD  Electronically Signed    BR/MEDQ  D:  09/07/2008  T:  09/08/2008  Job:  [id number]

## 2010-11-28 NOTE — Assessment & Plan Note (Signed)
[facility]                            CARDIOLOGY OFFICE NOTE   PACHIA, STRUM                    MRN:          [id number]  DATE:10/20/2007                            DOB:          1946/03/24    [name] returns, today, for manage the following issues:  1. Nonobstructive coronary disease by cath April 2007.  She had patent      stents in her right coronary artery at that time.  Her EF was 60%.  2. Mixed hyperlipidemia.  She has not had her lipids checked in at      least a year.  3. Tobacco use of three cigarettes a day.  She has some chronic      obstructive pulmonary disease, but certainly has cut back.  4. Other than some dyspnea on exertion, she has done well.  She      decided not to have her cystocele repair, last year, when I cleared      her for surgery.   MEDS:  1. Isosorbide 120 mg a day.  2. Lipitor 20 mg nightly.  3. Albuterol inhalers one t.i.d.  4. Zetia 10 mg a day.  5. Famotidine 40 mg a day.  6. Enteric-coated aspirin 325 mg a day.   PHYSICAL EXAMINATION:  Her blood pressure is 143/87; pulse is 72 and  regular.  Weight is 138 down 12.  Her respiratory rate is 20 and  unlabored.  HEENT:  She has a bronze complexion.  PERRL.  Extraocular movements are  intact.  Sclerae are clear.  Her face is significantly wrinkled which is  baseline.  Dentition satisfactory.  NECK:  Supple.  Carotid upstrokes were equal bilaterally without bruits.  No JVD.  Thyroid is not enlarged.  Trachea is midline.  LUNGS:  Reveal decreased breath sounds throughout but no wheezes, or  rhonchi.  HEART:  Reveals a nondisplaced PMI.  She has an S4.  ABDOMINAL EXAM:  Soft, good bowel sounds.  No midline bruit.  No  hepatomegaly.  EXTREMITIES:  No cyanosis, clubbing, or edema.  Pulses are present.  They are very bounding, actually 2+ or 4+ dorsalis pedis and posterior  tibial.  NEURO:  Exam is intact.   EKG:  Normal.   I had a long talk with  Ms. Proud today.  I have made no changes in her  medical program.  She needed her nitroglycerin, sublingual, renewed.  In  addition, she would like me to draw blood work, though she has eaten  today.  We will get a CMP as well as lipids.  I will see her back in a  year.    Thomas C. Daleen Squibb, MD, [facility]  Electronically Signed   TCW/MedQ  DD: 10/20/2007  DT: 10/20/2007  Job #: [id number]

## 2010-11-28 NOTE — Discharge Summary (Signed)
[name], [name]                ACCOUNT NO.:  [id number]   MEDICAL RECORD NO.:  [id number]          PATIENT TYPE:  INP   LOCATION:  2032                         FACILITY:  [facility]   PHYSICIAN:  Jesse Sans. Wall, MD, FACCDATE OF BIRTH:  [birth year]   DATE OF ADMISSION:  09/07/2008  DATE OF DISCHARGE:  09/09/2008                               DISCHARGE SUMMARY   PRIMARY CARDIOLOGIST:  Maisie Fus C. Wall, MD, [facility]   PRIMARY CARE PHYSICIAN:  Not listed.   PROCEDURES PERFORMED DURING HOSPITALIZATION:  None.   FINAL DISCHARGE DIAGNOSES:  1. Costochondritis.  2. Chronic obstructive pulmonary disease.  3. History of coronary artery disease, status post myocardial      infarction in 1995.      a.     Status post coronary artery bypass grafting in 2007.      b.     Nonobstructive coronary artery disease with a stent to the       right coronary artery.  4. Hyperlipidemia.  5. Gastroesophageal reflux disease.  6. Tobacco abuse.  7. History of thyroid surgery.   HOSPITAL COURSE:  A 65 year old Caucasian female with history  significant for CAD, status post stent in 1995 and cath in 2007 showing  nonobstructive coronary artery disease with a patent stent along with  COPD and ongoing tobacco abuse, who presented to [facility] complaining of chest discomfort, 7/10.  The patient stated the  pain was constant, radiating to her back, worse with movement and cough  and with taking a deep breath.  The patient secondary to the patient's  known history of CAD was transferred to [facility] for further  evaluation.  The patient had a CT angiogram at [facility]  that was negative for pulmonary embolus on an aortic dissection.  The  patient was transferred to [facility] and cardiac enzymes were cycled.  Cardiac enzymes were found to be negative at less than 0.01 and less  than 0.01 respectively.  The patient's pain was reproducible with  palpation on the right side,  positive chest wall tenderness consistent  with costochondritis.  The patient was started on prednisone and NSAIDs  along with pain medications and symptoms did improve.  The patient will  be discharged home after being seen by Dr. Valera Castle this morning on  steroid, Prevpac, and pain control.  She will continue to follow up with  her primary care physician as an outpatient and continue to see Dr.  Valera Castle on previously scheduled appointment in April.   CURRENT LABORATORY DATA HERE AT THE HOSPITAL:  TSH 0.858.  Troponin less  than 0.01 and 0.01 respectively.   DISCHARGE VITAL SIGNS:  Blood pressure 123/76, pulse 71, respirations  20, temperature 97.6, and O2 sat 100% on 3 liters.   DISCHARGE MEDICATIONS:  1. Prednisone Sterapred pack 10 mg tapered dose over 7 days.  2. Vicodin 5/325 p.r.n. every 4 hours for chest discomfort.  3. Advil 600 mg b.i.d. p.r.n. pain.  4. Isosorbide mononitrate 120 mg daily.  5. Aspirin 325 mg daily.  6. Zetia 10  mg daily.  7. Lipitor 20 mg daily.  8. Pepcid 20 mg daily.   ALLERGIES:  No known drug allergies.   FOLLOWUP PLANS AND APPOINTMENT:  1. The patient will follow up with Dr. Valera Castle on a previously      scheduled appointment on October 14, 2008, at 11 a.m.  2. The patient will follow up with primary care physician on her own      accord for continued medical management.  Note, the patient has not      been given any refills concerning her Vicodin dosing.   Time spent with the patient to include physician time 30 minutes.      Bettey Mare. Lyman Bishop, NP      Jesse Sans. Daleen Squibb, MD, [facility]  Electronically Signed    KML/MEDQ  D:  09/09/2008  T:  09/09/2008  Job:  [id number]

## 2010-12-01 NOTE — Discharge Summary (Signed)
[name], [name]                ACCOUNT NO.:  [id number]   MEDICAL RECORD NO.:  [id number]          PATIENT TYPE:  INP   LOCATION:  2001                         FACILITY:  [facility]   PHYSICIAN:  Noralyn Pick. Eden Emms, MD,FACCDATE OF BIRTH:  January 01, 1946   DATE OF ADMISSION:  03/09/2006  DATE OF DISCHARGE:  03/11/2006                                 DISCHARGE SUMMARY   PRIMARY CARDIOLOGIST:  Maisie Fus C. Wall, MD, [facility].   PRIMARY CARE PHYSICIAN:  Dr. Quintella Reichert, [name]   PRINCIPAL DIAGNOSIS:  Chest pain.   SECONDARY DIAGNOSES:  1. Coronary artery disease.  2. Chronic obstructive pulmonary disease.  3. Gastroesophageal reflux disease.  4. History of thyroid surgery.  5. Ongoing tobacco abuse, smoking anywhere between 4 and 10 cigarettes a      day.   ALLERGIES:  No known drug allergies.   PROCEDURE:  Left heart cardiac catheterization.   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  She was in her usual state of health  until approximately 1 o'clock p.m. on the March 09, 2006, when she  developed sharp pain in her left chest that would occur intermittently,  lasting just about a minute.  As the pain recurred, it would last for a  longer period of time and was more severe thus prompting her to present to  [facility] Emergency Room in [employer] at approximately 7 o'clock p.m. where  EKG showed sinus rhythm with nonspecific T and ST changes..  She was noted  to have elevated blood pressure at 171/95, and she was treated with IV  Lopressor and then transferred to [facility] for further evaluation.   HOSPITAL COURSE:  She ruled out for MI and, because her pain was similar to  previous angina, she was scheduled for left heart cardiac catheterization.  She underwent catheterization on the morning of August 27 revealing a normal  left main, a 20-30% stenosis of the LAD, a normal left circumflex, and a 30%  lesion in the PDA with  a widely patent stent in the mid RCA.  Status post  catheterization, she had no groin complications and has been ambulating  without difficulty.  She is being discharged to home this afternoon in  satisfactory condition.   DISCHARGE LABORATORY DATA:  Hemoglobin 13.3, hematocrit 38.1, WBC 6.6,  platelets 132.  Sodium 141, potassium 3.9, chloride 110, CO2 28, BUN 6,  creatinine 1, glucose 132, total bilirubin 0.5, alkaline phosphatase 110,  AST 23, ALT 23, total protein 6.2, albumin 3.5, calcium 9.0, magnesium 2.1.  CK 63, MB 1.4, troponin I 0.02. Total cholesterol 110, triglycerides 48, HDL  33, LDL 67.  TSH 0.282.   DISPOSITION:  [name] is being discharged home today in good condition.   FOLLOWUP PLANS AND APPOINTMENTS:  She is asked to follow up with her primary  care physician in 1-2 weeks regarding her low TSH.  She has followup with  Dr. Daleen Squibb on September 4 at 2:45 p.m..   DISCHARGE MEDICATIONS:  1. Aspirin 81 mg  daily.  2. Imdur 120 mg daily.  3. Lipitor 20 mg daily.  4. Zetia 10 mg daily.  5. Pepcid 20 mg daily.  6. Nitroglycerin 0.4 mg sublingual p.r.n. chest pain.   She had been counseled on the importance of smoking cessation and says she  will think about it.   OUTSTANDING LABORATORY STUDIES:  None.   DURATION OF DISCHARGE ENCOUNTER:  35 minutes including physician time.     ______________________________  Nicolasa Ducking, ANP    ______________________________  Noralyn Pick. Eden Emms, MD,FACC    CB/MEDQ  D:  03/11/2006  T:  03/11/2006  Job:  [id number]   cc:   Dr. Quintella Reichert, [name]

## 2010-12-01 NOTE — Procedures (Signed)
[place]  Patient:    KIANDRA, SANGUINETTI Visit Number: [id number] MRN: [id number]          Service Type: END Location: ENDO Attending Physician:  Dennison Bulla Ii Dictated by:   Verlin Grills, M.D. Proc. Date: 11/11/01 Admit Date:  11/11/2001 Discharge Date: 11/11/2001   CC:         Burnell Blanks, M.D., [facility], [place], [place]   Procedure Report  REFERRING PHYSICIAN:  Burnell Blanks, M.D., [facility], [place], [place].  PROCEDURE:  Colonoscopy.  PROCEDURE INDICATION:  Ms. Caramia Boutin is a 65 year old female born 04/21/1946.  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  Stool was sent for white blood cells and was negative for white blood cells.  Stool C. difficile toxin screen was positive.  I started Ms. Dahlstrom on Flagyl 48 hours ago.  I discussed with Ms. Coil the complications associated with colonoscopy and polypectomy, including a 15 per thousand risk of bleeding and a four per thousand risk of colon perforation requiring surgical repair.  Ms. Krone has signed the operative permit.  ENDOSCOPIST:  Verlin Grills, M.D.  PREMEDICATION:  Versed 7 mg, Demerol 50 mg.  ENDOSCOPE:  Olympus pediatric colonoscope.  DESCRIPTION OF PROCEDURE:  After obtaining informed consent, Ms. Mcaulay was placed in the left lateral decubitus position.  I administered intravenous Demerol and intravenous Versed to achieve conscious sedation for the procedure.  The patients blood pressure, oxygen saturation, and cardiac rhythm were monitored throughout the procedure and documented in the medical record.  Anal inspection was normal.  Digital rectal exam was normal.  The Olympus pediatric video colonoscope was introduced into the rectum and easily advanced to the cecum.  Colonic preparation for the exam today was excellent.  Rectum normal.  Sigmoid colon and descending  colon normal.  Splenic flexure normal.  Transverse colon normal.  Hepatic flexure normal.  Ascending colon normal.  Cecum and ileocecal valve normal.  ASSESSMENT:  Normal proctocolonoscopy to the cecum.  No endoscopic evidence for the presence of colorectal neoplasia or inflammatory bowel disease.  RECOMMENDATIONS:  Ms. Haldeman will receive a 10-day course of Flagyl 250 mg t.i.d. to treat her C. difficile toxin-positive diarrhea. Dictated by:   Verlin Grills, M.D. Attending Physician:  Dennison Bulla Ii DD:  11/11/01 TD:  11/11/01 Job: [id number]

## 2010-12-01 NOTE — Assessment & Plan Note (Signed)
[facility]                            CARDIOLOGY OFFICE NOTE   Vanessa, SPORRER                    MRN:          [id number]  DATE:10/31/2006                            DOB:          1946/01/01    [name] comes in today for surgical clearance for a cystocele repair  with Dr. Leota Jacobsen at [facility] in [place].   From a cardiac standpoint, she has had no significant angina. She does  have dyspnea on exertion, which is most likely her chronic obstructive  pulmonary disease and history of tobacco use.   She was admitted for chest pain and ruled out for myocardial infarction  in August of 2007. At that time, she had a cardiac catheterization that  showed non-obstructive disease with patent stents in the right coronary  vessel. Her left ventricular function was normal with an EF of 60%.   CURRENT MEDICATIONS:  1. Isosorbide 120 mg p.o. daily.  2. Lipitor 20 mg p.o. q nightly.  3. Ecotrin 500 mg a day.  4. Albuterol solution inhaler three times a day.  5. Duo-Neb daily.  6. Zetia 10 mg a day.  7. Famotidine 40 mg a day.   Her blood pressure is 132/70, pulse is 73 and regular, weight is 150.  She is no acute distress.  HEENT: Normocephalic, atraumatic. PERRLA. Extra-ocular movements intact.  Sclerae are clear. Facial symmetry is normal. NECK: Supple. Carotids are  full without bruits. There is no JVD.  Thyroid is not enlarged. Trachea  is midline.  LUNGS:  Are clear, except for decreased breath sounds throughout.  HEART: PMI is nondisplaced. She has normal S1, S2.  ABDOMEN: Soft with good bowel sounds.  EXTREMITIES: Reveals no cyanosis, clubbing or edema. Pulses are present.   EKG today is normal.   ASSESSMENT/PLAN:  Coronary artery disease which is currently stable. She  has normal left ventricular systolic function with a recent  catheterization showing a patent stent to the right coronary artery. She  is having  only dyspnea on exertion which is most likely pulmonary.   I have written a note to clear her for surgery. I think her risk is low  from a cardiovascular standpoint.   I will plan on seeing her back in a year.     Thomas C. Daleen Squibb, MD, [facility]  Electronically Signed    TCW/MedQ  DD: 10/31/2006  DT: 10/31/2006  Job #: [id number]   cc:   Hassell Done

## 2010-12-01 NOTE — Cardiovascular Report (Signed)
NAMEKRISTIA, [name]                ACCOUNT NO.:  [id number]   MEDICAL RECORD NO.:  [id number]          PATIENT TYPE:  INP   LOCATION:  2001                         FACILITY:  [facility]   PHYSICIAN:  Noralyn Pick. Nishan, MD,FACCDATE OF BIRTH:  September 15, 1945   DATE OF PROCEDURE:  03/11/2006  DATE OF DISCHARGE:                              CARDIAC CATHETERIZATION   INDICATIONS:  Chest pain.   HISTORY OF PRESENT ILLNESS:  History of distant stent to the right coronary  artery, I believe, in 1995.   Same catheterization was done with 6-French catheter to semi-femoral artery.  At the end of the case, the femoral arteriotomy site received AngioSeal with  good hemostasis.   Left main coronary artery was normal.   Left anterior descending artery at 20-30%.  Tubular disease in the mid  vessel.  Distal vessel and first diagonal branch were normal.   Circumflex coronary artery was nondominant.  There was 20% __________lesions  in the mid vessel.  First obtuse marginal branch was normal.   Right coronary artery was dominant.  There were widely patent stents in the  mid vessel.  The PDA had 20-30% tubular disease.   Right ventriculography.  Right ventriculography was normal.  EF was 60%.  There was no grain across the aortic valve and no MR.  The aortic pressure  was in the 130/69 range.  LV pressure was in the 135/6 range.   Iliac angiography shows to be in good position for AngioSeal.   IMPRESSION:  The patient's sustained what appeared to be a noncardiac in  etiology.  She needs to stop smoking.  Her chest pain may be related to some  bronchitis or COPD.  She seems well enough to go home later today.  As long  as her groin heals well, she will follow up with Dr. Daleen Squibb.           ______________________________  Noralyn Pick. Eden Emms, MD,FACC     PCN/MEDQ  D:  03/11/2006  T:  03/11/2006  Job:  [id number]

## 2010-12-01 NOTE — Assessment & Plan Note (Signed)
[facility]                              CARDIOLOGY OFFICE NOTE   NAME:[name]                    MRN:          [id number]  DATE:03/19/2006                            DOB:          Apr 22, 1946    [name] returns today for further management of her coronary disease and  hyperlipidemia.   She was admitted with chest pain and ruled out for infarct.  She underwent  cardiac cath on March 11, 2006, which showed nonobstructive disease with  patent stents in a right coronary vessel.  Left ventricular function was  normal, EF of 60%.   Her lipids were under pretty good control, except for a low HDL, but her LDL  was 67, on Zetia and Lipitor.   Since discharge, she has had no other pain.   PHYSICAL EXAMINATION:  Blood pressure is 120/68, pulse is 82.  Her weight is  153.  Carotids are full without JVD.  There are no bruits.  The thyroid is not  enlarged, trachea is midline.  LUNGS:  Clear, except for decreased breath sounds throughout.  HEART:  Reveals a regular rate and rhythm.  ABDOMEN:  Exam is soft.  EXTREMITIES:  Reveal no edema.  Pulses are intact.   ASSESSMENT AND PLAN:  [name] is doing well.  I have asked her to  continue with her current medical program.  We will see her back in a year.                               Thomas C. Daleen Squibb, MD, [facility]    TCW/MedQ  DD:  03/19/2006  DT:  03/20/2006  Job #:  [id number]   cc:   Burnell Blanks, MD

## 2010-12-01 NOTE — H&P (Signed)
[name], [name]                ACCOUNT NO.:  [id number]   MEDICAL RECORD NO.:  [id number]          PATIENT TYPE:  INP   LOCATION:  2001                         FACILITY:  [facility]   PHYSICIAN:  Marrian Salvage. Freida Busman, MD     DATE OF BIRTH:  1946-03-26   DATE OF ADMISSION:  03/09/2006  DATE OF DISCHARGE:                                HISTORY & PHYSICAL   CARDIOLOGIST:  Maisie Fus C. Wall, MD.   PRIMARY MEDICAL DOCTOR:  Dr. Quintella Reichert in [place].   PULMONOLOGIST:  Dr. __________  in [place]   CHIEF COMPLAINTS:  Chest pain.   HISTORY OF PRESENT ILLNESS:  The patient is a 65 year old female with a  history of coronary artery disease.  Unfortunately none of her records are  available on the [facility] computer site.  Per her report she had 2  myocardial infarctions back in about 1995 resulting ultimately in a  percutaneous coronary intervention and stent placement to an unknown vessel.  The patient also has significant COPD.  She continues to smoke.  Over the  last few years she has had rare quick fleeting chest pains at times, but  these have been stable.  She has followed up annually with [facility]  cardiology.  She reportedly had a stress test about a year ago that was a  dobutamine study, and she was told it was negative.   She was in her usual state of health until today at about 1:00 p.m.  While  sitting and watching her grandson play she had onset of sharp pain in her  left chest.  Initially it would come and go lasting about a minute each  time.  Then it progressed in length and severity and then stayed.  It  eventually became a 9/10 in severity.  It was not pleuritic.  It radiated  from the left chest into the right arm and right neck.  She took  nitroglycerin, 4 tablets total, and each tablet helped only briefly.  Finally she had her family bring her to the local hospital.   In the [facility] Emergency Department at 7 o'clock EKG showed sinus  rhythm.  She had some nonspecific  T-wave and ST changes.  Her blood pressure  was mildly elevated at 171/95.  Pulse was 104.  She was given 3  nitroglycerin and then Nitro paste with resolution of her chest pain.  She  has had no further chest pain since that time.  She was also given Lopressor  5 IV x2.  She had taken aspirin that morning.  The family requested urgent  transfer to [facility].  She arrived in stable condition chest pain free.   PAST MEDICAL HISTORY:  1. Coronary artery disease.  2. COPD.  No home oxygen.  3. GERD.  4. Status post thyroid surgery.  5. Ongoing tobacco abuse.   ALLERGIES:  No known drug allergies.   CURRENT MEDICATIONS:  1. Imdur 120 mg in the morning.  2. Lipitor 20 mg nightly.  3. Zetia 10 mg nightly.  4. Ecotrin 500 mg daily.  5. Pepcid 20 mg  nightly.  6. DuoNeb t.i.d.  7. Nitroglycerin as needed.   SOCIAL HISTORY:  The patient lives in [place], alone.  She has been  widowed for 3 years.  She has two children whose families live nearby.  She  had previously worked in [employer], but is now on disability.  She continues to smoke 2 to 3 packs per day of cigarettes; and has a 100  pack-year history.  She does not use alcohol or drugs.  She does not  exercise or have significant physical activity.   FAMILY HISTORY:  The patient's mother died of a myocardial infarction at age  54.  Father died of a stroke at age 23.  She has one brother and three  sisters who are all alive and well.   REVIEW OF SYSTEMS:  Positive for chest pain.  Otherwise she also described  mild edema, some mild increase in her cough; and a postprandial fullness  that has been getting worse.  Otherwise 14-system review is negative in  detail.   ADVANCED DIRECTIVES:  Full code.   PHYSICAL EXAMINATION:  VITAL SIGNS:  Temperature 96.7, pulse 62, blood  pressure 128/74.  On transfer respirations 20, saturation 99% on 2 liters.  GENERAL:  The patient lives in no distress.  SKIN:  No rash.  No  jaundice.  HEENT:  She has an upper denture and two teeth on the bottom.  No oral  lesions.  NECK:  JVP appeared flat.  No carotid bruits.  She had an old well-healed  scar over the thyroid without thyromegaly.  LUNGS:  Mildly distant without significant crackles or wheeze.  HEART:  Regular rate and rhythm with normal S1-S2.  No S3, no S4, and no  murmur.  ABDOMEN:  Soft, nontender with no palpable hepatomegaly.  There was no  fullness noted in the epigastric region.  CHEST:  Her chest wall is nontender.  GENITALIA:  Her right groin had a good 2+ pulse with no bruit.  EXTREMITIES:  No edema and were warm.  NEUROLOGICALLY:  She was intact.   CHEST X-RAY:  Has just been ordered.   EKG:  Shows a rate of 101 in sinus tachycardia with normal axis, QRS of 90  showing an RSR prime in V1.  No Q waves, some scooped STs inferiorly; and  some ST depressions anterolaterally about 1-mm with T wave inversion in V1  and T wave flattening in aVL all of which were fairly nonspecific.  Prior  EKG was unavailable for comparison.  There were no ST elevations.   LABORATORIES:  White blood cell count 9, hematocrit 43, platelets 182.  Sodium 142, potassium 3.6, bicarb 23, BUN 8, creatinine 0.8, glucose 117.  LFTs were normal.  Albumin 4.2, troponin negative.  CK 90, BNP 23.  PT 10.7,  PTT 26, calcium 10.   IMPRESSION:  A 65 year old female with known coronary disease and ongoing  tobacco abuse who presents with fairly typical anginal symptoms.  She has  had angina on-and-off in the past which is treated with high-dose Imdur; but  this episode was clearly much more in severity and occurred at rest.  She  says this feels similar to when she had a heart attack in the past.  My  suspicion is relatively high for acute coronary syndrome with vulnerable  plaque.   PROBLEM #1.  Unstable angina.  Rule out myocardial infarction with troponins.  Telemetry.  Treat with aspirin and heparin.  No 2B3A at this  point  as her troponins and negative EKG is not markedly abnormal.  I have  added an ACE inhibitor to her regimen.  Will continue the statin and Zetia.  Apparently she has not tolerated a beta blocker in the past due to her COPD.  We will try and get her stress test results from 2006; as well as get a  comparison EKG in the morning.  I will see if I can get that off the [facility]  the computer system tonight.  Otherwise, she will need to stay in house  until Monday for further risk stratification.  I think that an argument  could be made for either dobutamine stress or catheterization.  However  given her symptoms similar to prior myocardial infarction and her last  catheterization occurring apparently greater than 7 years ago I would lean  towards repeat catheterization.  She has been written for n.p.o. after  tomorrow night; and to get some IV fluids Monday morning.   Problem #2:  Chronic obstructive pulmonary disease.  Nebulizers as before,  wean oxygen.   Problem #3:  Smoking.  Cessation counseling was provided.  The patient does  not wish to quit smoking at this time; and had relatively little interest in  counseling.   Problem #4:  Status post thyroid surgery.  Check a TSH.   Problem #5:  Abdominal fullness.  I think this is unrelated to her current  episode of chest pain.  However, there is some possibility that she has a  worsening gastroesophageal reflux disease; and that has manifested, today,  as some chest pain.  I am going to switch her H2 blocker to a proton pump  inhibitor high-dose and sees if that helps.   Problem #6:  Prophylaxis.  Heparin.  Gastrografin suppression.  An AHA diet.   DISPOSITION:  The patient has an appointment in followup scheduled for  March 19, 2006.  Hopefully she can go home Monday evening after  catheterization.           ______________________________  Marrian Salvage Freida Busman, MD     LAA/MEDQ  D:  03/09/2006  T:  03/10/2006  Job:  [id number]   cc:    Dr. Quintella Reichert

## 2011-01-22 ENCOUNTER — Telehealth: Payer: Self-pay | Admitting: Cardiology

## 2011-01-22 NOTE — Telephone Encounter (Signed)
In speaking with granddaughter, [name], pt was seen at the [facility] Friday 01/19/11.   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

## 2011-01-22 NOTE — Telephone Encounter (Signed)
Pt granddaughter calling. Pt went to Urgent Care on Friday (01/19/2011). 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 Daleen Squibb. Would like nurse to call to advise.

## 2011-01-22 NOTE — Telephone Encounter (Signed)
Called [facility] and they will be faxing our office their report and test results. Mylo Red RN

## 2011-01-22 NOTE — Telephone Encounter (Signed)
Line busy x3 at pt residence. Mylo Red RN

## 2011-01-24 ENCOUNTER — Telehealth: Payer: Self-pay | Admitting: Cardiology

## 2011-01-24 NOTE — Telephone Encounter (Signed)
Fax received 01/24/11 from urgent care. Will forward to Dr. Daleen Squibb for review Mylo Red RN

## 2011-01-24 NOTE — Telephone Encounter (Signed)
Pt's daughter had called on Monday regarding the clogged artery in her stomach.  Was to get reports from Urgent Care and call them back.  Has these reports been received and could you please call her back.  Grandmother is in some discomfort.

## 2011-01-24 NOTE — Telephone Encounter (Signed)
I spoke with [name].  She is aware Dr. Daleen Squibb has reviewed pt Urgent care report and pt will follow-up with Dr. Quintella Reichert her pcp. Mylo Red RN

## 2011-03-27 ENCOUNTER — Ambulatory Visit (INDEPENDENT_AMBULATORY_CARE_PROVIDER_SITE_OTHER): Payer: Medicare PPO | Admitting: Cardiology

## 2011-03-27 ENCOUNTER — Encounter: Payer: Self-pay | Admitting: Cardiology

## 2011-03-27 VITALS — BP 138/70 | HR 79 | Ht 67.0 in | Wt 135.0 lb

## 2011-03-27 DIAGNOSIS — I2510 Atherosclerotic heart disease of native coronary artery without angina pectoris: Secondary | ICD-10-CM

## 2011-03-27 DIAGNOSIS — F17200 Nicotine dependence, unspecified, uncomplicated: Secondary | ICD-10-CM

## 2011-03-27 MED ORDER — BUPROPION HCL ER (SR) 150 MG PO TB12: 150.0000 mg | ORAL_TABLET | Freq: Two times a day (BID) | ORAL | Status: DC

## 2011-03-27 NOTE — Assessment & Plan Note (Signed)
Stable.  No change in medication. 

## 2011-03-27 NOTE — Patient Instructions (Signed)
Your physician has recommended you make the following change in your medication  Wellbutrin-- Take 1 tablet daily for 3 days.  Then take 1 tablet twice a day.  Your physician recommends that you schedule a follow-up appointment in: 3 months with Dr. Daleen Squibb

## 2011-03-27 NOTE — Assessment & Plan Note (Signed)
She is having signs of progressive deterioration of her lung disease not to mention strain on her heart and blood vessels. A long talk again with her today that she has to stop smoking now. She is willing to try Wellbutrin. I will see her back in 3 months for a close followup.

## 2011-03-27 NOTE — Progress Notes (Signed)
HPI [name] comes in today for evaluation and management of her history of coronary artery disease.  She states she just don't feel well. If short of breath and has feeling of a rapid heartbeat with activity. She denies any angina. She was also told reasons she had some blockage in her arteries in her stomach. Retractors down but there was no mention on a scan. However she  Has ongoing risk factors including smoking. He still smokes about 10  Cigarettes a day. Her lungs are getting worse.  She also takes several inhalers that can increase her heart rate. I explained all this to her today. Her she denies any symptoms of TIAs or mini strokes. She's had no claudication.   EKG today is normal. Past Medical History  Diagnosis Date  . Coronary atherosclerosis of native coronary vessel   . Hyperlipidemia   . Costochondritis   . COPD (chronic obstructive pulmonary disease)   . GERD (gastroesophageal reflux disease)     Past Surgical History  Procedure Date  . Coronary artery bypass graft 2007  . Thyroid surgery     No family history on file.  History   Social History  . Marital Status: Married    Spouse Name: N/A    Number of Children: N/A  . Years of Education: N/A   Occupational History  . Not on file.   Social History Main Topics  . Smoking status: Current Everyday Smoker -- 0.5 packs/day  . Smokeless tobacco: Not on file  . Alcohol Use: No  . Drug Use: Not on file  . Sexually Active: Not on file   Other Topics Concern  . Not on file   Social History Narrative  . No narrative on file    No Known Allergies  Current Outpatient Prescriptions  Medication Sig Dispense Refill  . albuterol (PROAIR HFA) 108 (90 BASE) MCG/ACT inhaler Inhale 2 puffs into the lungs as needed.        Marland Kitchen aspirin 325 MG EC tablet Take 325 mg by mouth daily.        Marland Kitchen atorvastatin (LIPITOR) 80 MG tablet Take 80 mg by mouth at bedtime.        . budesonide-formoterol (SYMBICORT) 160-4.5 MCG/ACT  inhaler Inhale 2 puffs into the lungs as needed.       . isosorbide mononitrate (IMDUR) 120 MG 24 hr tablet Take 120 mg by mouth daily.        . nitroGLYCERIN (NITROSTAT) 0.4 MG SL tablet Place 1 tablet (0.4 mg total) under the tongue every 5 (five) minutes as needed for chest pain.  25 tablet  8  . omeprazole (PRILOSEC) 20 MG capsule Take 40 mg by mouth 2 (two) times daily.          ROS Negative other than HPI.   PE General Appearance: well developed, well nourished in no acute distress,looks older than stated age. HEENT: symmetrical face, PERRLA, good dentition  Neck: no JVD, thyromegaly, or adenopathy, trachea midline Chest: symmetric without deformity Cardiac: PMI non-displaced, RRR, normal S1, S2, no gallop or murmur Lung: clear to ausculation and percussion Vascular: all pulses full without bruits  Abdominal: nondistended, nontender, good bowel sounds, no HSM, no bruits Extremities: no cyanosis, clubbing or edema, no sign of DVT, no varicosities  Skin: normal color, no rashes Neuro: alert and oriented x 3, non-focal Pysch: normal affect Filed Vitals:   03/27/11 1410  BP: 138/70  Pulse: 79  Height: 5\' 7"  (1.702 m)  Weight: 135 lb (  61.236 kg)    EKG  Labs and Studies Reviewed.   Lab Results  Component Value Date   WBC 9.5 05/11/2010   HGB 16.0* 05/11/2010   HCT 47.2* 05/11/2010   MCV 92.6 05/11/2010   PLT 184.0 05/11/2010      Chemistry      Component Value Date/Time   NA 138 05/11/2010 0925   K 4.5 05/11/2010 0925   CL 102 05/11/2010 0925   CO2 27 05/11/2010 0925   BUN 10 05/11/2010 0925   CREATININE 0.8 05/11/2010 0925      Component Value Date/Time   CALCIUM 9.6 05/11/2010 0925   ALKPHOS 102 06/28/2010 0938   AST 17 06/28/2010 0938   ALT 15 06/28/2010 0938   BILITOT 0.6 06/28/2010 0938       Lab Results  Component Value Date   CHOL 112 06/28/2010   CHOL 173 05/08/2010   CHOL 125 09/27/2009   Lab Results  Component Value Date   HDL 37.50*  06/28/2010   HDL 44.80 05/08/2010   HDL 45.80 09/27/2009   Lab Results  Component Value Date   LDLCALC 54 06/28/2010   LDLCALC 111* 05/08/2010   LDLCALC 44 09/27/2009   Lab Results  Component Value Date   TRIG 105.0 06/28/2010   TRIG 84.0 05/08/2010   TRIG 176.0* 09/27/2009   Lab Results  Component Value Date   CHOLHDL 3 06/28/2010   CHOLHDL 4 05/08/2010   CHOLHDL 3 09/27/2009   No results found for this basename: HGBA1C   Lab Results  Component Value Date   ALT 15 06/28/2010   AST 17 06/28/2010   ALKPHOS 102 06/28/2010   BILITOT 0.6 06/28/2010   Lab Results  Component Value Date   TSH 0.49 05/11/2010

## 2011-05-28 ENCOUNTER — Other Ambulatory Visit: Payer: Self-pay | Admitting: Cardiology

## 2011-06-28 ENCOUNTER — Ambulatory Visit: Payer: Medicare PPO | Admitting: Cardiology

## 2011-06-29 ENCOUNTER — Other Ambulatory Visit: Payer: Self-pay | Admitting: Cardiology

## 2011-07-24 ENCOUNTER — Encounter: Payer: Self-pay | Admitting: Cardiology

## 2011-10-17 ENCOUNTER — Ambulatory Visit (INDEPENDENT_AMBULATORY_CARE_PROVIDER_SITE_OTHER): Payer: Medicare PPO | Admitting: Physician Assistant

## 2011-10-17 ENCOUNTER — Encounter: Payer: Self-pay | Admitting: Physician Assistant

## 2011-10-17 VITALS — BP 160/76 | HR 81 | Ht 67.0 in | Wt 145.0 lb

## 2011-10-17 DIAGNOSIS — I2510 Atherosclerotic heart disease of native coronary artery without angina pectoris: Secondary | ICD-10-CM

## 2011-10-17 DIAGNOSIS — I10 Essential (primary) hypertension: Secondary | ICD-10-CM

## 2011-10-17 DIAGNOSIS — R0600 Dyspnea, unspecified: Secondary | ICD-10-CM

## 2011-10-17 DIAGNOSIS — F17200 Nicotine dependence, unspecified, uncomplicated: Secondary | ICD-10-CM

## 2011-10-17 DIAGNOSIS — R609 Edema, unspecified: Secondary | ICD-10-CM

## 2011-10-17 DIAGNOSIS — R0989 Other specified symptoms and signs involving the circulatory and respiratory systems: Secondary | ICD-10-CM

## 2011-10-17 LAB — BASIC METABOLIC PANEL
Chloride: 105 mEq/L (ref 96–112)
Potassium: 4.3 mEq/L (ref 3.5–5.1)

## 2011-10-17 LAB — BRAIN NATRIURETIC PEPTIDE: Pro B Natriuretic peptide (BNP): 74 pg/mL (ref 0.0–100.0)

## 2011-10-17 MED ORDER — ATENOLOL 50 MG PO TABS: 50.0000 mg | ORAL_TABLET | Freq: Every day | ORAL | Status: DC

## 2011-10-17 NOTE — Patient Instructions (Addendum)
Your physician wants you to follow-up in: 3-4 months. You will receive a reminder letter in the mail two months in advance. If you don't receive a letter, please call our office to schedule the follow-up appointment. Your physician recommends that you return for lab work drawn today (BMP & BNP) Your physician has requested that you have a lexiscan myoview. For further information please visit [email or web address] Please follow instruction sheet, as given. Please get some compression sock and wear daily Your physician has recommended you make the following change in your medication: INCREASE Atenolol to 50 mg daily

## 2011-10-17 NOTE — Progress Notes (Signed)
[address] [place], [place]  [address] Phone: [phone] Fax:  [phone]  Date:  10/17/2011   Name:  [name]       DOB:  1946-02-04 MRN:  [id number]  PCP:  Dr. Feliciana Rossetti  Primary Cardiologist:  Dr. Valera Castle  Primary Electrophysiologist:  None    History of Present Illness: [name] is a 66 y.o. female who presents for follow up.  She has a history of CAD, status post prior stenting to the RCA, hyperlipidemia, COPD, GERD.  LHC 02/2006: LAD 20-30%, circumflex 20% mid, RCA widely patent stent in the mid vessel, PDA 20-30%, EF 60%.  She last saw Dr. Daleen Squibb 03/2011.  He placed her on Wellbutrin at that time to help with smoking cessation.  Plan was for 3 month followup.  She quit smoking!!  Took Wellbutrin for 3 mos.  Also using E-cigs now.    She notes chronic DOE.  No changes.  She has occ chest pains.  These are chronic and have not changed.  She sleeps on 2 pillows without change.  Notes increased HR with activity.  This is stable.  She denies syncope.  Notes some lightheadedness with standing too quickly.  She has noted LE edema and abdominal bloating.  Also notes a lot of leg pain with rest and with exertion.  She has gained 10 lbs since her last visit.  She is worried about her heart.  She does note occasional jaw pain over the last 6 mos.  She states she had jaw pain around the time of her MI many years ago.    Past Medical History  Diagnosis Date  . Coronary atherosclerosis of native coronary vessel   . Hyperlipidemia   . Costochondritis   . COPD (chronic obstructive pulmonary disease)   . GERD (gastroesophageal reflux disease)     Current Outpatient Prescriptions  Medication Sig Dispense Refill  . albuterol (PROAIR HFA) 108 (90 BASE) MCG/ACT inhaler Inhale 2 puffs into the lungs as needed.        Marland Kitchen aspirin 325 MG EC tablet Take 325 mg by mouth daily.        Marland Kitchen atenolol (TENORMIN) 25 MG tablet Take 25 mg by mouth daily.       . furosemide (LASIX) 20 MG tablet Take 20 mg by mouth daily.      . isosorbide mononitrate (IMDUR) 120 MG 24 hr tablet TAKE ONE TABLET BY MOUTH EVERY DAY  30 tablet  6  . LIPITOR 80 MG tablet TAKE ONE TABLET BY MOUTH AT BEDTIME  30 each  4  . nitroGLYCERIN (NITROSTAT) 0.4 MG SL tablet Place 1 tablet (0.4 mg total) under the tongue every 5 (five) minutes as needed for chest pain.  25 tablet  8  . omeprazole (PRILOSEC) 20 MG capsule Take 40 mg by mouth 2 (two) times daily.          Allergies: No Known Allergies  History  Substance Use Topics  . Smoking status: Former Smoker -- 0.5 packs/day  . Smokeless tobacco: Not on file  . Alcohol Use: No     ROS:  Please see the history of present illness.   All other systems reviewed and negative.   PHYSICAL EXAM: VS:  BP 160/76  Pulse 81  Ht 5\' 7"  (1.702 m)  Wt 145 lb (65.772 kg)  BMI 22.71 kg/m2 Repeat BP by me 140/84  Well nourished, well developed, in no acute distress HEENT: normal  Neck: no JVD Vasc: no carotid bruits; DP/PT 2+ bilat Cardiac:  normal S1, S2; RRR; no murmur Lungs:  Decreased BS bilaterally, no wheezing, rhonchi or rales Abd: soft, nontender, no hepatomegaly Ext: no edema; multiple varicosities noted. Skin: warm and dry Neuro:  CNs 2-12 intact, no focal abnormalities noted  EKG:  Sinus rhythm, heart rate 81, normal axis, nonspecific ST-T wave changes, no change from prior tracing  ASSESSMENT AND PLAN:  1. CAD  She is worried about her LE edema and there being a problem with her heart.  There is no significant edema on exam.  However, she does note some jaw symptoms over the last 6 mos.  No evaluation since cath in 2007 when her stent was patent and she had no obstructive disease.  I recommend ETT.  But, the patient notes she cannot walk on a treadmill.  Plan Lexiscan Myoview.  Follow up with Dr. Valera Castle in 3-4 mos, or sooner if myoview abnormal.   2. TOBACCO USER  I congratulated her on quitting.   3. HTN  (hypertension)  Elevated.  With her palps, will increase Atenolol to 50 mg QD.   4. Edema  No edema noted on exam.  No clear signs of CHF.  I will check a BNP.  If abnormal, adjust lasix and schedule an echo.  She has varicose veins.  I have recommended compression socks to be worn daily.  If BNP normal, I suggest she follow up with her PCP to evaluate for other causes of abdominal bloating.       Signed, Tereso Newcomer, PA-C  2:32 PM 10/17/2011

## 2011-10-25 ENCOUNTER — Ambulatory Visit (HOSPITAL_COMMUNITY): Payer: Medicare PPO | Attending: Internal Medicine | Admitting: Radiology

## 2011-10-25 VITALS — BP 144/78 | Ht 67.0 in | Wt 145.0 lb

## 2011-10-25 DIAGNOSIS — R002 Palpitations: Secondary | ICD-10-CM | POA: Insufficient documentation

## 2011-10-25 DIAGNOSIS — J4489 Other specified chronic obstructive pulmonary disease: Secondary | ICD-10-CM | POA: Insufficient documentation

## 2011-10-25 DIAGNOSIS — J449 Chronic obstructive pulmonary disease, unspecified: Secondary | ICD-10-CM | POA: Insufficient documentation

## 2011-10-25 DIAGNOSIS — I2510 Atherosclerotic heart disease of native coronary artery without angina pectoris: Secondary | ICD-10-CM | POA: Insufficient documentation

## 2011-10-25 DIAGNOSIS — R0989 Other specified symptoms and signs involving the circulatory and respiratory systems: Secondary | ICD-10-CM | POA: Insufficient documentation

## 2011-10-25 DIAGNOSIS — R079 Chest pain, unspecified: Secondary | ICD-10-CM | POA: Insufficient documentation

## 2011-10-25 DIAGNOSIS — R0602 Shortness of breath: Secondary | ICD-10-CM

## 2011-10-25 DIAGNOSIS — I10 Essential (primary) hypertension: Secondary | ICD-10-CM | POA: Insufficient documentation

## 2011-10-25 DIAGNOSIS — Z87891 Personal history of nicotine dependence: Secondary | ICD-10-CM | POA: Insufficient documentation

## 2011-10-25 DIAGNOSIS — E785 Hyperlipidemia, unspecified: Secondary | ICD-10-CM | POA: Insufficient documentation

## 2011-10-25 DIAGNOSIS — R51 Headache: Secondary | ICD-10-CM | POA: Insufficient documentation

## 2011-10-25 DIAGNOSIS — R000 Tachycardia, unspecified: Secondary | ICD-10-CM | POA: Insufficient documentation

## 2011-10-25 DIAGNOSIS — I252 Old myocardial infarction: Secondary | ICD-10-CM | POA: Insufficient documentation

## 2011-10-25 DIAGNOSIS — R0609 Other forms of dyspnea: Secondary | ICD-10-CM | POA: Insufficient documentation

## 2011-10-25 MED ORDER — REGADENOSON 0.4 MG/5ML IV SOLN
0.4000 mg | Freq: Once | INTRAVENOUS | Status: AC
  Administered 2011-10-25: 0.4 mg via INTRAVENOUS

## 2011-10-25 MED ORDER — TECHNETIUM TC 99M TETROFOSMIN IV KIT
33.0000 | PACK | Freq: Once | INTRAVENOUS | Status: AC | PRN
  Administered 2011-10-25: 33 via INTRAVENOUS

## 2011-10-25 MED ORDER — TECHNETIUM TC 99M TETROFOSMIN IV KIT
11.0000 | PACK | Freq: Once | INTRAVENOUS | Status: AC | PRN
  Administered 2011-10-25: 11 via INTRAVENOUS

## 2011-10-25 NOTE — Progress Notes (Signed)
[facility] SITE 3 NUCLEAR MED [address] [place] [address] [phone]  Cardiology Nuclear Med Study  [name] is a 66 y.o. female     MRN : [id number]     DOB: 1945-08-06  Procedure Date: 10/25/2011  Nuclear Med Background Indication for Stress Test:  Evaluation for Ischemia and Stent Patency History:  COPD and 1995-MI, Stent-RCA, 2005 MPS- EF 55% No ischemia, 2007 Heart Catheterization- N/O CAD , stent patent, EF 60% Cardiac Risk Factors: History of Smoking, Hypertension and Lipids  Symptoms:  Chest Pain, DOE, Light-Headedness, Palpitations, Rapid HR and SOB   Nuclear Pre-Procedure Caffeine/Decaff Intake:  None NPO After: 7:00am   Lungs:  clear O2 Sat: 98% on room air. IV 0.9% NS with Angio Cath:  22g  IV Site: R Antecubital  IV Started by:  Bonnita Levan, RN  Chest Size (in):  38 Cup Size: B  Height: 5\' 7"  (1.702 m)  Weight:  145 lb (65.772 kg)  BMI:  Body mass index is 22.71 kg/(m^2). Tech Comments:  N/A    Nuclear Med Study 1 or 2 day study: 1 day  Stress Test Type:  Lexiscan  Reading MD: Marca Ancona, MD  Order Authorizing Provider:  Dr. Valera Castle, MD  Resting Radionuclide: Technetium 53m Tetrofosmin  Resting Radionuclide Dose: 11.0 mCi   Stress Radionuclide:  Technetium 28m Tetrofosmin  Stress Radionuclide Dose: 33.0 mCi           Stress Protocol Rest HR: 65 Stress HR: 96  Rest BP: 144/78 Stress BP: 155/81  Exercise Time (min): n/a METS: n/a   Predicted Max HR: 154 bpm % Max HR: 62.34 bpm Rate Pressure Product: 78469   Dose of Adenosine (mg):  n/a Dose of Lexiscan: 0.4 mg  Dose of Atropine (mg): n/a Dose of Dobutamine: n/a mcg/kg/min (at max HR)  Stress Test Technologist: Bonnita Levan, RN  Nuclear Technologist:  Domenic Polite, CNMT     Rest Procedure:  Myocardial perfusion imaging was performed at rest 45 minutes following the intravenous administration of Technetium 39m Tetrofosmin. Rest ECG: NSR - Normal  EKG  Stress Procedure:  The patient received IV Lexiscan 0.4 mg over 15-seconds.  Technetium 73m Tetrofosmin injected at 30-seconds.  There were no significant changes with Lexiscan.  Quantitative spect images were obtained after a 45 minute delay. Stress ECG: No significant change from baseline ECG  QPS Raw Data Images:  Normal; no motion artifact; normal heart/lung ratio. Stress Images:  Medium-sized, moderate basal to mid inferolateral perfusion defect.  Rest Images:  Medium-sized, moderate basal to mid inferolateral perfusion defect.  Subtraction (SDS):  Fixed medium-sized basal to mid inferolateral perfusion defect.  Transient Ischemic Dilatation (Normal <1.22): 1.03 Lung/Heart Ratio (Normal <0.45): 0.33  Quantitative Gated Spect Images QGS EDV: 81  ml QGS ESV: 30  ml  Impression Exercise Capacity:  Lexiscan with no exercise. BP Response:  Normal blood pressure response. Clinical Symptoms:  Dyspnea, chest pressure.  ECG Impression:  No significant ST segment change suggestive of ischemia. Comparison with Prior Nuclear Study: No significant change from previous study  Overall Impression:  Abnormal stress nuclear study.  Fixed, medium-sized moderate basal to mid inferolateral perfusion defect suggestive of prior MI.  No evidence for significant ischemia.   LV Ejection Fraction: 63%.  LV Wall Motion:  Mild basal inferolateral hypokinesis.   Brentlee Sciara [employer]

## 2012-01-02 ENCOUNTER — Telehealth: Payer: Self-pay | Admitting: Cardiology

## 2012-01-02 MED ORDER — ATORVASTATIN CALCIUM 80 MG PO TABS: ORAL_TABLET | ORAL | Status: DC

## 2012-01-02 NOTE — Telephone Encounter (Signed)
A prescription was send to [facility] pharmacy for Atorvastatin (Lipitor) 80 mg once a day, patient is aware.

## 2012-01-02 NOTE — Telephone Encounter (Signed)
Please return call to patient at [phone] regarding medications

## 2012-02-05 ENCOUNTER — Other Ambulatory Visit: Payer: Self-pay | Admitting: Cardiology

## 2012-02-19 ENCOUNTER — Encounter: Payer: Self-pay | Admitting: Cardiology

## 2012-02-19 ENCOUNTER — Ambulatory Visit (INDEPENDENT_AMBULATORY_CARE_PROVIDER_SITE_OTHER): Payer: Medicare PPO | Admitting: Cardiology

## 2012-02-19 VITALS — BP 132/70 | HR 62 | Ht 67.0 in | Wt 143.0 lb

## 2012-02-19 DIAGNOSIS — J4489 Other specified chronic obstructive pulmonary disease: Secondary | ICD-10-CM

## 2012-02-19 DIAGNOSIS — I2510 Atherosclerotic heart disease of native coronary artery without angina pectoris: Secondary | ICD-10-CM

## 2012-02-19 DIAGNOSIS — E785 Hyperlipidemia, unspecified: Secondary | ICD-10-CM

## 2012-02-19 DIAGNOSIS — R609 Edema, unspecified: Secondary | ICD-10-CM

## 2012-02-19 DIAGNOSIS — F17200 Nicotine dependence, unspecified, uncomplicated: Secondary | ICD-10-CM

## 2012-02-19 DIAGNOSIS — J449 Chronic obstructive pulmonary disease, unspecified: Secondary | ICD-10-CM

## 2012-02-19 NOTE — Assessment & Plan Note (Signed)
No signs or symptoms of ischemia. Recent Myoview reviewed with patient. Return the office in one year.

## 2012-02-19 NOTE — Patient Instructions (Addendum)
Your physician recommends that you return for lab work in: 4 weeks for fasting cholesterol level.  Your physician recommends that you continue on your current medications as directed. Please refer to the Current Medication list given to you today.  Your physician recommends that you schedule a follow-up appointment in:1 year with Dr. Daleen Squibb

## 2012-02-19 NOTE — Assessment & Plan Note (Signed)
Improved. There was probably some sort of pulmonary vascular component to this. Not smoking has helped obviously.

## 2012-02-19 NOTE — Progress Notes (Signed)
HPI Mrs. Couvillon returns today for close followup for recent increasing shortness of breath and lower extremity edema. Please see extensive evaluation by Tereso Newcomer.  Her BNP was only 74. Stress Myoview showed normal left ventricular systolic function and no ischemia. Since  quitting smoking, she still has a persistent cough is nonproductive but her breathing is slowly improving. Her edema has resolved and she is no longer using Lasix. Ask her use it when necessary.  Past Medical History  Diagnosis Date  . Coronary atherosclerosis of native coronary vessel   . Hyperlipidemia   . Costochondritis   . COPD (chronic obstructive pulmonary disease)   . GERD (gastroesophageal reflux disease)     Current Outpatient Prescriptions  Medication Sig Dispense Refill  . albuterol (PROAIR HFA) 108 (90 BASE) MCG/ACT inhaler Inhale 2 puffs into the lungs as needed.        Marland Kitchen aspirin 325 MG EC tablet Take 325 mg by mouth daily.        Marland Kitchen atenolol (TENORMIN) 50 MG tablet Take 1 tablet (50 mg total) by mouth daily.  30 tablet  6  . atorvastatin (LIPITOR) 80 MG tablet Take one tablet by mouth daily at bed time.  30 tablet  11  . isosorbide mononitrate (IMDUR) 120 MG 24 hr tablet TAKE ONE TABLET BY MOUTH EVERY DAY  30 tablet  5  . nitroGLYCERIN (NITROSTAT) 0.4 MG SL tablet Place 1 tablet (0.4 mg total) under the tongue every 5 (five) minutes as needed for chest pain.  25 tablet  8  . omeprazole (PRILOSEC) 20 MG capsule Take 40 mg by mouth 2 (two) times daily.          No Known Allergies  No family history on file.  History   Social History  . Marital Status: Married    Spouse Name: N/A    Number of Children: N/A  . Years of Education: N/A   Occupational History  . Not on file.   Social History Main Topics  . Smoking status: Former Smoker -- 0.5 packs/day  . Smokeless tobacco: Not on file  . Alcohol Use: No  . Drug Use: Not on file  . Sexually Active: Not on file   Other Topics Concern  .  Not on file   Social History Narrative  . No narrative on file    ROS ALL NEGATIVE EXCEPT THOSE NOTED IN HPI  PE  General Appearance: well developed, well nourished in no acute distress HEENT: symmetrical face, PERRLA,  Neck: no JVD, thyromegaly, or adenopathy, trachea midline Chest: symmetric without deformity Cardiac: PMI non-displaced, RRR, soft S1, S2, no gallop or murmur Lung: clear to ausculation and percussion, decreased breath sounds throughout but no significant wheezing or rhonchi Vascular: all pulses full without bruits  Abdominal: nondistended, nontender, good bowel sounds, no HSM, no bruits Extremities: no cyanosis, clubbing or edema, no sign of DVT, no varicosities  Skin: normal color, no rashes Neuro: alert and oriented x 3, non-focal Pysch: normal affect  EKG  BMET    Component Value Date/Time   NA 144 10/17/2011 1449   K 4.3 10/17/2011 1449   CL 105 10/17/2011 1449   CO2 29 10/17/2011 1449   GLUCOSE 104* 10/17/2011 1449   BUN 8 10/17/2011 1449   CREATININE 0.7 10/17/2011 1449   CALCIUM 9.3 10/17/2011 1449   GFRNONAA 78.71 05/11/2010 0925   GFRAA 82 10/20/2007 0000    Lipid Panel     Component Value Date/Time   CHOL 112  06/28/2010 0938   TRIG 105.0 06/28/2010 0938   HDL 37.50* 06/28/2010 0938   CHOLHDL 3 06/28/2010 0938   VLDL 21.0 06/28/2010 0938   LDLCALC 54 06/28/2010 0938    CBC    Component Value Date/Time   WBC 9.5 05/11/2010 0925   RBC 5.10 05/11/2010 0925   HGB 16.0* 05/11/2010 0925   HCT 47.2* 05/11/2010 0925   PLT 184.0 05/11/2010 0925   MCV 92.6 05/11/2010 0925   MCHC 33.8 05/11/2010 0925   RDW 14.2 05/11/2010 0925   LYMPHSABS 2.4 05/11/2010 0925   MONOABS 1.0 05/11/2010 0925   EOSABS 0.1 05/11/2010 0925   BASOSABS 0.0 05/11/2010 1610

## 2012-02-19 NOTE — Assessment & Plan Note (Signed)
She continues not to smoke. I told her that her cough which slowly improved and that her breathing hopefully will stabilize.

## 2012-03-19 ENCOUNTER — Other Ambulatory Visit (INDEPENDENT_AMBULATORY_CARE_PROVIDER_SITE_OTHER): Payer: Medicare PPO

## 2012-03-19 DIAGNOSIS — E785 Hyperlipidemia, unspecified: Secondary | ICD-10-CM

## 2012-03-19 LAB — HEPATIC FUNCTION PANEL
ALT: 13 U/L (ref 0–35)
AST: 15 U/L (ref 0–37)
Albumin: 4.1 g/dL (ref 3.5–5.2)
Alkaline Phosphatase: 89 U/L (ref 39–117)
Total Protein: 7.1 g/dL (ref 6.0–8.3)

## 2012-03-19 LAB — LIPID PANEL
Cholesterol: 122 mg/dL (ref 0–200)
LDL Cholesterol: 53 mg/dL (ref 0–99)
Triglycerides: 138 mg/dL (ref 0.0–149.0)

## 2012-07-02 ENCOUNTER — Other Ambulatory Visit: Payer: Self-pay | Admitting: *Deleted

## 2012-07-02 MED ORDER — ISOSORBIDE MONONITRATE ER 120 MG PO TB24: 120.0000 mg | ORAL_TABLET | Freq: Every day | ORAL | Status: DC

## 2012-07-02 MED ORDER — ATORVASTATIN CALCIUM 80 MG PO TABS: ORAL_TABLET | ORAL | Status: DC

## 2012-10-06 ENCOUNTER — Telehealth: Payer: Self-pay | Admitting: Cardiology

## 2012-10-06 NOTE — Telephone Encounter (Signed)
Pt calling re BP running low, 98/65 72 pulse taken at 8a this morning

## 2012-10-06 NOTE — Telephone Encounter (Signed)
Pt called because she said she has been having problems with her BP, and heart rate,which are running  Low. B/P has been in the 90's systaltic. HR 58 to 61 beats/ minute. [place] PA in [place] discontinue her Atenolol 25 mg because  the PA said this medication was making her heart rate too low. The PA prescribed Norvasc 5 mg daily. Still her BP is running low. Pt has not taken her BP medication for a week. Now her B/P is 111/77, heart rate is 75 beats/minute. Pt is aware to continue holding her Norvasc, and to wait to see what Dr. Daleen Squibb recommends. Pt verbalized understanding.

## 2012-10-06 NOTE — Telephone Encounter (Signed)
LMTCB

## 2012-10-06 NOTE — Telephone Encounter (Signed)
F/u   Patient returning nurse call, would like a return call at [phone]

## 2012-10-07 NOTE — Telephone Encounter (Signed)
She needs to follow up with those that put her on Norvasc.

## 2012-10-08 NOTE — Telephone Encounter (Signed)
F/u   Pt calling back because she hasn't spoken to anyone

## 2012-10-08 NOTE — Telephone Encounter (Signed)
Pt calls to report blood pressures, find out what she should do about the Norvasc. Dr. Daleen Squibb has reviewed. Pt is aware she will need to contact PA who prescribed this for her.  Recent BPs:  96/60 HR 62 ,  124/83 66 , 98/65  She did have right jaw pain this am & " my tongue felt weird" .  She took 1 sl NTG with relief Her blood pressure after this episode was:  134/84 HR 78  Mylo Red [employer]

## 2012-11-18 ENCOUNTER — Other Ambulatory Visit: Payer: Self-pay | Admitting: Cardiology

## 2012-12-23 ENCOUNTER — Other Ambulatory Visit: Payer: Self-pay | Admitting: Internal Medicine

## 2013-01-26 ENCOUNTER — Other Ambulatory Visit: Payer: Self-pay | Admitting: Cardiology

## 2013-01-26 ENCOUNTER — Other Ambulatory Visit: Payer: Self-pay | Admitting: Internal Medicine

## 2013-02-27 ENCOUNTER — Telehealth: Payer: Self-pay | Admitting: *Deleted

## 2013-02-27 ENCOUNTER — Other Ambulatory Visit: Payer: Self-pay | Admitting: Internal Medicine

## 2013-02-27 NOTE — Telephone Encounter (Signed)
lmom returning pt call from earlier today. Pt has question about meds.

## 2013-03-30 ENCOUNTER — Other Ambulatory Visit: Payer: Self-pay | Admitting: Cardiology

## 2013-04-07 ENCOUNTER — Ambulatory Visit (INDEPENDENT_AMBULATORY_CARE_PROVIDER_SITE_OTHER): Payer: Medicare Other | Admitting: Cardiovascular Disease

## 2013-04-07 ENCOUNTER — Encounter: Payer: Self-pay | Admitting: Cardiovascular Disease

## 2013-04-07 VITALS — BP 150/80 | HR 84 | Ht 67.0 in | Wt 148.0 lb

## 2013-04-07 DIAGNOSIS — E785 Hyperlipidemia, unspecified: Secondary | ICD-10-CM

## 2013-04-07 DIAGNOSIS — I10 Essential (primary) hypertension: Secondary | ICD-10-CM

## 2013-04-07 DIAGNOSIS — I2510 Atherosclerotic heart disease of native coronary artery without angina pectoris: Secondary | ICD-10-CM

## 2013-04-07 MED ORDER — ASPIRIN EC 81 MG PO TBEC: 81.0000 mg | DELAYED_RELEASE_TABLET | Freq: Every day | ORAL | Status: DC

## 2013-04-07 NOTE — Patient Instructions (Signed)
Your physician wants you to follow-up in:  6 months.  You will receive a reminder letter in the mail two months in advance. If you don't receive a letter, please call our office to schedule the follow-up appointment.  Your physician has recommended you make the following change in your medication:  Decrease aspirin to 81 mg by mouth daily  Check blood pressure at home and keep record of readings.

## 2013-04-07 NOTE — Progress Notes (Signed)
History of Present Illness: 67 yo female with history of CAD, HLD, COPD, GERD here today for cardiac follow up. She has been followed in the past by Dr. Daleen Squibb. She has a history of CAD, status post bare metal stent RCA in 1995. Last cardiac cath 02/2006: LAD 20-30%, circumflex 20% mid, RCA widely patent stent in the mid vessel, PDA 20-30%, EF 60%. She was seen by Tereso Newcomer, PA-C April 2013 and had c/o jaw pain, LE edema and dyspnea. Stress myoview 10/29/11 with fixed defect inferior wall c/w scar, no ischemia. Edema resolved with Lasix.   She is here today for follow up. No chest pain or SOB. "Pulling" in thighs when walking. No cramping in calves. She stopped smoking and is using electronic cigarettes.   Primary Care Physician: Hooper,Jeffrey  Last Lipid Profile:Lipid Panel     Component Value Date/Time   CHOL 122 03/19/2012 1018   TRIG 138.0 03/19/2012 1018   HDL 41.40 03/19/2012 1018   CHOLHDL 3 03/19/2012 1018   VLDL 27.6 03/19/2012 1018   LDLCALC 53 03/19/2012 1018     Past Medical History  Diagnosis Date  . Coronary atherosclerosis of native coronary vessel     Bare metal stent 1995  . Hyperlipidemia   . Costochondritis   . COPD (chronic obstructive pulmonary disease)   . GERD (gastroesophageal reflux disease)   . Tobacco abuse     Past Surgical History  Procedure Laterality Date  . Thyroid surgery    . Abdominal hysterectomy    . Lithotripsy    . Cholecystectomy      Current Outpatient Prescriptions  Medication Sig Dispense Refill  . albuterol (PROVENTIL) (2.5 MG/3ML) 0.083% nebulizer solution Take 2.5 mg by nebulization every 6 (six) hours as needed for wheezing.      Marland Kitchen aspirin 325 MG EC tablet Take 325 mg by mouth daily.        Marland Kitchen atorvastatin (LIPITOR) 80 MG tablet Take 1 tablet (80 mg total) by mouth daily.  30 tablet  0  . budesonide-formoterol (SYMBICORT) 160-4.5 MCG/ACT inhaler Inhale 2 puffs into the lungs 4 (four) times daily as needed.      . famotidine (PEPCID) 20  MG tablet Take 20 mg by mouth 2 (two) times daily.      Marland Kitchen gabapentin (NEURONTIN) 400 MG capsule Take 400 mg by mouth every 8 (eight) hours.      . isosorbide mononitrate (IMDUR) 120 MG 24 hr tablet TAKE ONE TABLET BY MOUTH ONCE DAILY  30 tablet  3  . nitroGLYCERIN (NITROSTAT) 0.4 MG SL tablet Place 1 tablet (0.4 mg total) under the tongue every 5 (five) minutes as needed for chest pain.  25 tablet  8   No current facility-administered medications for this visit.    No Known Allergies  History   Social History  . Marital Status: Married    Spouse Name: N/A    Number of Children: 1  . Years of Education: N/A   Occupational History  . DISABILITY    Social History Main Topics  . Smoking status: Former Smoker -- 0.50 packs/day for 50 years    Types: Cigarettes  . Smokeless tobacco: Not on file  . Alcohol Use: No  . Drug Use: No  . Sexual Activity: Not on file   Other Topics Concern  . Not on file   Social History Narrative  . No narrative on file    Family History  Problem Relation Age of Onset  . Heart  attack Father   . Heart attack Mother     Review of Systems:  As stated in the HPI and otherwise negative.   BP 150/80  Pulse 84  Ht 5\' 7"  (1.702 m)  Wt 148 lb (67.132 kg)  BMI 23.17 kg/m2  Physical Examination: General: Well developed, well nourished, NAD HEENT: OP clear, mucus membranes moist SKIN: warm, dry. No rashes. Neuro: No focal deficits Musculoskeletal: Muscle strength 5/5 all ext Psychiatric: Mood and affect normal Neck: No JVD, no carotid bruits, no thyromegaly, no lymphadenopathy. Lungs:Clear bilaterally, no wheezes, rhonci, crackles Cardiovascular: Regular rate and rhythm. No murmurs, gallops or rubs. Abdomen:Soft. Bowel sounds present. Non-tender.  Extremities: No lower extremity edema. Pulses are 2 + in the bilateral DP/PT.  Stress myoview April 2013: Exercise Capacity: Lexiscan with no exercise.  BP Response: Normal blood pressure response.    Clinical Symptoms: Dyspnea, chest pressure.  ECG Impression: No significant ST segment change suggestive of ischemia.  Comparison with Prior Nuclear Study: No significant change from previous study  Overall Impression: Abnormal stress nuclear study. Fixed, medium-sized moderate basal to mid inferolateral perfusion defect suggestive of prior MI. No evidence for significant ischemia.  LV Ejection Fraction: 63%. LV Wall Motion: Mild basal inferolateral hypokinesis.   Assessment and Plan:   1. CAD: Stable. Recent normal stress test 2013. Continue current medical therapy.   2. Hyperlipidemia: Continue statin. Lipids followed in primary care.   3. HTN: BP elevated today. She will follow at home and let us know if it remains above 150 consistently.

## 2013-04-30 ENCOUNTER — Other Ambulatory Visit: Payer: Self-pay

## 2013-04-30 MED ORDER — ATORVASTATIN CALCIUM 80 MG PO TABS: 80.0000 mg | ORAL_TABLET | Freq: Every day | ORAL | Status: DC

## 2013-06-01 ENCOUNTER — Telehealth: Payer: Self-pay | Admitting: Cardiovascular Disease

## 2013-06-01 NOTE — Telephone Encounter (Signed)
Spoke with patient who reports she is not having any chest pain.  I advised her that I will call Albin Felling at [facility] to make her aware Dr. Clifton James has cleared patient for surgery. I left a message for Albin Felling in the pre-anesthesia screening dept at [facility] to advise her Dr. Clifton James has cleared patient for surgery.

## 2013-06-01 NOTE — Telephone Encounter (Signed)
New problem    Surgery on tomorrow  11/18 . From the last office notes can patient be clear for surgery.

## 2013-06-01 NOTE — Telephone Encounter (Signed)
Spoke with Albin Felling at [facility] who states patient is scheduled for cystoscopy with pyelogram under general anesthesia and needs cardiac clearance.  Albin Felling states patient is willing to wait for procedure if further cardiac testing is needed in order to be able to have procedure under general anesthesia.  I advised Albin Felling that I am sending message to Dr. Clifton James as he is working in the hospital today.  Albin Felling verbalized understanding and agreement.

## 2013-06-01 NOTE — Telephone Encounter (Signed)
If she is doing well and no recent chest pains, she can proceed with the surgery. Earney Hamburg

## 2013-07-04 ENCOUNTER — Other Ambulatory Visit: Payer: Self-pay | Admitting: Internal Medicine

## 2013-10-07 ENCOUNTER — Ambulatory Visit: Payer: Medicare HMO | Admitting: Cardiovascular Disease

## 2013-11-16 ENCOUNTER — Ambulatory Visit: Payer: Medicare Other | Admitting: Cardiovascular Disease

## 2013-11-24 ENCOUNTER — Other Ambulatory Visit: Payer: Self-pay | Admitting: Internal Medicine

## 2013-11-27 ENCOUNTER — Ambulatory Visit (INDEPENDENT_AMBULATORY_CARE_PROVIDER_SITE_OTHER): Payer: Commercial Managed Care - HMO | Admitting: Cardiovascular Disease

## 2013-11-27 ENCOUNTER — Encounter: Payer: Self-pay | Admitting: Cardiovascular Disease

## 2013-11-27 ENCOUNTER — Encounter: Payer: Medicare Other | Admitting: Cardiovascular Disease

## 2013-11-27 VITALS — BP 120/56 | HR 84 | Ht 63.0 in | Wt 142.0 lb

## 2013-11-27 DIAGNOSIS — I2510 Atherosclerotic heart disease of native coronary artery without angina pectoris: Secondary | ICD-10-CM

## 2013-11-27 DIAGNOSIS — I10 Essential (primary) hypertension: Secondary | ICD-10-CM

## 2013-11-27 DIAGNOSIS — E785 Hyperlipidemia, unspecified: Secondary | ICD-10-CM

## 2013-11-27 LAB — LIPID PANEL
CHOL/HDL RATIO: 3
Cholesterol: 135 mg/dL (ref 0–200)
HDL: 39.7 mg/dL (ref >39.00)
LDL Cholesterol: 66 mg/dL (ref 0–99)
TRIGLYCERIDES: 146 mg/dL (ref 0.0–149.0)
VLDL: 29.2 mg/dL (ref 0.0–40.0)

## 2013-11-27 LAB — HEPATIC FUNCTION PANEL
ALBUMIN: 3.8 g/dL (ref 3.5–5.2)
ALT: 19 U/L (ref 0–35)
AST: 22 U/L (ref 0–37)
Alkaline Phosphatase: 120 U/L — ABNORMAL HIGH (ref 39–117)
Bilirubin, Direct: 0.2 mg/dL (ref 0.0–0.3)
Total Bilirubin: 0.9 mg/dL (ref 0.2–1.2)
Total Protein: 6.7 g/dL (ref 6.0–8.3)

## 2013-11-27 NOTE — Progress Notes (Signed)
History of Present Illness: 68 yo female with history of CAD, HLD, COPD, GERD here today for cardiac follow up. She has been followed in the past by Dr. Daleen SquibbWall. She has a history of CAD, status post bare metal stent RCA in 1995. Last cardiac cath 02/2006: LAD 20-30%, circumflex 20% mid, RCA widely patent stent in the mid vessel, PDA 20-30%, EF 60%. She was seen by Tereso NewcomerScott Weaver, PA-C April 2013 and had c/o jaw pain, LE edema and dyspnea. Stress myoview 10/29/11 with fixed defect inferior wall c/w scar, no ischemia. Edema resolved with Lasix.   She is here today for follow up. No chest pain or SOB. She has started back smoking 1/2 ppd and is using electronic cigarettes. She is having trouble with kidney stones. She has been off of ASA with recent hematuria.   [facility]  Last Lipid Profile:  Lipid Panel     Component Value Date/Time   CHOL 122 03/19/2012 1018   TRIG 138.0 03/19/2012 1018   HDL 41.40 03/19/2012 1018   CHOLHDL 3 03/19/2012 1018   VLDL 27.6 03/19/2012 1018   LDLCALC 53 03/19/2012 1018     Past Medical History  Diagnosis Date  . Coronary atherosclerosis of native coronary vessel     Bare metal stent 1995  . Hyperlipidemia   . Costochondritis   . COPD (chronic obstructive pulmonary disease)   . GERD (gastroesophageal reflux disease)   . Tobacco abuse     Past Surgical History  Procedure Laterality Date  . Thyroid surgery    . Abdominal hysterectomy    . Lithotripsy    . Cholecystectomy      Current Outpatient Prescriptions  Medication Sig Dispense Refill  . albuterol (PROVENTIL) (2.5 MG/3ML) 0.083% nebulizer solution Take 2.5 mg by nebulization every 6 (six) hours as needed for wheezing.      Marland Kitchen. aspirin 81 MG tablet Take 1 tablet (81 mg total) by mouth daily.  30 tablet  6  . atorvastatin (LIPITOR) 80 MG tablet Take 1 tablet (80 mg total) by mouth daily.  30 tablet  6  . DALIRESP 500 MCG TABS tablet       . famotidine (PEPCID) 20 MG  tablet Take 20 mg by mouth 2 (two) times daily.      Marland Kitchen. gabapentin (NEURONTIN) 400 MG capsule Take 400 mg by mouth every 8 (eight) hours.      . isosorbide mononitrate (IMDUR) 120 MG 24 hr tablet TAKE ONE TABLET BY MOUTH ONCE DAILY  30 tablet  0  . nitroGLYCERIN (NITROSTAT) 0.4 MG SL tablet Place 1 tablet (0.4 mg total) under the tongue every 5 (five) minutes as needed for chest pain.  25 tablet  8   No current facility-administered medications for this visit.    No Known Allergies  History   Social History  . Marital Status: Married    Spouse Name: N/A    Number of Children: 1  . Years of Education: N/A   Occupational History  . DISABILITY    Social History Main Topics  . Smoking status: Current Some Day Smoker -- 0.50 packs/day for 50 years    Types: Cigarettes  . Smokeless tobacco: Not on file  . Alcohol Use: No  . Drug Use: No  . Sexual Activity: Not on file   Other Topics Concern  . Not on file   Social History Narrative  . No narrative on file    Family History  Problem Relation Age of Onset  . Heart attack Father   . Heart attack Mother     Review of Systems:  As stated in the HPI and otherwise negative.   BP 120/56  Pulse 84  Ht 5\' 3"  (1.6 m)  Wt 142 lb (64.411 kg)  BMI 25.16 kg/m2  Physical Examination: General: Well developed, well nourished, NAD HEENT: OP clear, mucus membranes moist SKIN: warm, dry. No rashes. Neuro: No focal deficits Musculoskeletal: Muscle strength 5/5 all ext Psychiatric: Mood and affect normal Neck: No JVD, no carotid bruits, no thyromegaly, no lymphadenopathy. Lungs:Clear bilaterally, no wheezes, rhonci, crackles Cardiovascular: Regular rate and rhythm. No murmurs, gallops or rubs. Abdomen:Soft. Bowel sounds present. Non-tender.  Extremities: No lower extremity edema. Pulses are 2 + in the bilateral DP/PT.  Stress myoview April 2013: Exercise Capacity: Lexiscan with no exercise.  BP Response: Normal blood pressure  response.  Clinical Symptoms: Dyspnea, chest pressure.  ECG Impression: No significant ST segment change suggestive of ischemia.  Comparison with Prior Nuclear Study: No significant change from previous study  Overall Impression: Abnormal stress nuclear study. Fixed, medium-sized moderate basal to mid inferolateral perfusion defect suggestive of prior MI. No evidence for significant ischemia.  LV Ejection Fraction: 63%. LV Wall Motion: Mild basal inferolateral hypokinesis.   EKG: sinus, rate 84 bpm. PAC. Non-specific ST abnormality.   Assessment and Plan:   1. CAD: Stable. Recent normal stress test 2013. Continue current medical therapy.   2. Hyperlipidemia: Continue statin. Will check lipids and LFTs today.   3. HTN: BP controlled today. No changes.

## 2013-11-27 NOTE — Progress Notes (Signed)
No show

## 2013-11-27 NOTE — Patient Instructions (Signed)
Your physician wants you to follow-up in:  12 months.  You will receive a reminder letter in the mail two months in advance. If you don't receive a letter, please call our office to schedule the follow-up appointment.   

## 2013-12-01 ENCOUNTER — Other Ambulatory Visit: Payer: Self-pay

## 2013-12-01 MED ORDER — NITROGLYCERIN 0.4 MG SL SUBL: 0.4000 mg | SUBLINGUAL_TABLET | SUBLINGUAL | Status: DC | PRN

## 2013-12-28 ENCOUNTER — Other Ambulatory Visit: Payer: Self-pay | Admitting: Cardiovascular Disease

## 2013-12-28 ENCOUNTER — Other Ambulatory Visit: Payer: Self-pay | Admitting: Internal Medicine

## 2014-01-12 ENCOUNTER — Emergency Department (HOSPITAL_COMMUNITY)
Admission: EM | Admit: 2014-01-12 | Discharge: 2014-01-12 | Disposition: A | Discharge status: Home / Self Care | Payer: Medicare HMO | Attending: Emergency Medicine | Admitting: Emergency Medicine

## 2014-01-12 ENCOUNTER — Encounter (HOSPITAL_COMMUNITY): Payer: Self-pay | Admitting: Emergency Medicine

## 2014-01-12 ENCOUNTER — Emergency Department (HOSPITAL_COMMUNITY): Payer: Medicare HMO

## 2014-01-12 DIAGNOSIS — Z7982 Long term (current) use of aspirin: Secondary | ICD-10-CM | POA: Insufficient documentation

## 2014-01-12 DIAGNOSIS — J4489 Other specified chronic obstructive pulmonary disease: Secondary | ICD-10-CM | POA: Insufficient documentation

## 2014-01-12 DIAGNOSIS — Z8739 Personal history of other diseases of the musculoskeletal system and connective tissue: Secondary | ICD-10-CM | POA: Insufficient documentation

## 2014-01-12 DIAGNOSIS — R002 Palpitations: Secondary | ICD-10-CM | POA: Insufficient documentation

## 2014-01-12 DIAGNOSIS — I2510 Atherosclerotic heart disease of native coronary artery without angina pectoris: Secondary | ICD-10-CM | POA: Insufficient documentation

## 2014-01-12 DIAGNOSIS — J449 Chronic obstructive pulmonary disease, unspecified: Secondary | ICD-10-CM | POA: Insufficient documentation

## 2014-01-12 DIAGNOSIS — N200 Calculus of kidney: Secondary | ICD-10-CM | POA: Insufficient documentation

## 2014-01-12 DIAGNOSIS — F17200 Nicotine dependence, unspecified, uncomplicated: Secondary | ICD-10-CM | POA: Insufficient documentation

## 2014-01-12 DIAGNOSIS — R197 Diarrhea, unspecified: Secondary | ICD-10-CM

## 2014-01-12 DIAGNOSIS — K219 Gastro-esophageal reflux disease without esophagitis: Secondary | ICD-10-CM | POA: Insufficient documentation

## 2014-01-12 DIAGNOSIS — Z79899 Other long term (current) drug therapy: Secondary | ICD-10-CM | POA: Insufficient documentation

## 2014-01-12 DIAGNOSIS — R112 Nausea with vomiting, unspecified: Secondary | ICD-10-CM | POA: Insufficient documentation

## 2014-01-12 DIAGNOSIS — Z9861 Coronary angioplasty status: Secondary | ICD-10-CM | POA: Insufficient documentation

## 2014-01-12 DIAGNOSIS — E785 Hyperlipidemia, unspecified: Secondary | ICD-10-CM | POA: Insufficient documentation

## 2014-01-12 LAB — URINALYSIS, ROUTINE W REFLEX MICROSCOPIC
BILIRUBIN URINE: NEGATIVE
Glucose, UA: NEGATIVE mg/dL
Ketones, ur: NEGATIVE mg/dL
Leukocytes, UA: NEGATIVE
Nitrite: NEGATIVE
PH: 5.5 (ref 5.0–8.0)
Protein, ur: NEGATIVE mg/dL
SPECIFIC GRAVITY, URINE: 1.016 (ref 1.005–1.030)
Urobilinogen, UA: 0.2 mg/dL (ref 0.0–1.0)

## 2014-01-12 LAB — COMPREHENSIVE METABOLIC PANEL
ALT: 15 U/L (ref 0–35)
AST: 20 U/L (ref 0–37)
Albumin: 4 g/dL (ref 3.5–5.2)
Alkaline Phosphatase: 157 U/L — ABNORMAL HIGH (ref 39–117)
BILIRUBIN TOTAL: 0.6 mg/dL (ref 0.3–1.2)
BUN: <3 mg/dL — ABNORMAL LOW (ref 6–23)
CHLORIDE: 105 meq/L (ref 96–112)
CO2: 26 meq/L (ref 19–32)
CREATININE: 0.62 mg/dL (ref 0.50–1.10)
Calcium: 9.7 mg/dL (ref 8.4–10.5)
GFR calc Af Amer: >90 mL/min (ref >90)
GFR calc non Af Amer: >90 mL/min (ref >90)
GLUCOSE: 131 mg/dL — AB (ref 70–99)
Potassium: 4.1 mEq/L (ref 3.7–5.3)
Sodium: 145 mEq/L (ref 137–147)
Total Protein: 7.6 g/dL (ref 6.0–8.3)

## 2014-01-12 LAB — CBC WITH DIFFERENTIAL/PLATELET
BASOS ABS: 0 10*3/uL (ref 0.0–0.1)
Basophils Relative: 0 % (ref 0–1)
Eosinophils Absolute: 0 10*3/uL (ref 0.0–0.7)
Eosinophils Relative: 0 % (ref 0–5)
HEMATOCRIT: 50.3 % — AB (ref 36.0–46.0)
Hemoglobin: 16.7 g/dL — ABNORMAL HIGH (ref 12.0–15.0)
LYMPHS ABS: 2.2 10*3/uL (ref 0.7–4.0)
LYMPHS PCT: 21 % (ref 12–46)
MCH: 29.7 pg (ref 26.0–34.0)
MCHC: 33.2 g/dL (ref 30.0–36.0)
MCV: 89.5 fL (ref 78.0–100.0)
MONO ABS: 0.8 10*3/uL (ref 0.1–1.0)
Monocytes Relative: 7 % (ref 3–12)
NEUTROS ABS: 7.5 10*3/uL (ref 1.7–7.7)
Neutrophils Relative %: 72 % (ref 43–77)
Platelets: 199 10*3/uL (ref 150–400)
RBC: 5.62 MIL/uL — AB (ref 3.87–5.11)
RDW: 13.1 % (ref 11.5–15.5)
WBC: 10.5 10*3/uL (ref 4.0–10.5)

## 2014-01-12 LAB — URINE MICROSCOPIC-ADD ON

## 2014-01-12 LAB — I-STAT TROPONIN, ED: Troponin i, poc: 0 ng/mL (ref 0.00–0.08)

## 2014-01-12 LAB — LIPASE, BLOOD: Lipase: 89 U/L — ABNORMAL HIGH (ref 11–59)

## 2014-01-12 MED ORDER — PROMETHAZINE HCL 12.5 MG PO TABS
12.5000 mg | ORAL_TABLET | Freq: Once | ORAL | Status: AC
  Administered 2014-01-12: 12.5 mg via ORAL
  Filled 2014-01-12: qty 1

## 2014-01-12 MED ORDER — PROMETHAZINE HCL 12.5 MG PO TABS: 12.5000 mg | ORAL_TABLET | Freq: Four times a day (QID) | ORAL | Status: DC | PRN

## 2014-01-12 MED ORDER — MORPHINE SULFATE 4 MG/ML IJ SOLN
4.0000 mg | Freq: Once | INTRAMUSCULAR | Status: AC
  Administered 2014-01-12: 4 mg via INTRAVENOUS
  Filled 2014-01-12: qty 1

## 2014-01-12 MED ORDER — ONDANSETRON HCL 4 MG/2ML IJ SOLN
4.0000 mg | Freq: Once | INTRAMUSCULAR | Status: AC
  Administered 2014-01-12: 4 mg via INTRAVENOUS
  Filled 2014-01-12: qty 2

## 2014-01-12 MED ORDER — SODIUM CHLORIDE 0.9 % IV BOLUS (SEPSIS)
1000.0000 mL | Freq: Once | INTRAVENOUS | Status: AC
  Administered 2014-01-12: 1000 mL via INTRAVENOUS

## 2014-01-12 MED ORDER — OXYCODONE-ACETAMINOPHEN 5-325 MG PO TABS: 1.0000 | ORAL_TABLET | ORAL | Status: DC | PRN

## 2014-01-12 MED ORDER — SUCRALFATE 1 G PO TABS: 1.0000 g | ORAL_TABLET | Freq: Three times a day (TID) | ORAL | Status: DC

## 2014-01-12 NOTE — Discharge Instructions (Signed)
You have a kidney stone on the right side. This will need further follow up with urology. Take percocet as prescribed as needed for pain. Take carafate for stomach burning. Take phenergan for nausea. Follow up with your gastroenterologist as soon as able. Return if symptoms worsening.   Ureteral Colic Ureteral colic is spasm-like pain from the kidney or the ureter. This is often caused by a kidney stone. The pain is caused by the stone trying to get through the tubes that pass your pee. HOME CARE   Drink enough fluids to keep your pee (urine) clear or pale yellow.  Strain all your pee. A strainer will be provided. Keep anything caught in the strainer and bring it to your doctor. The stone causing the pain may be very small.  Only take medicine as told by your doctor.  Follow up with your doctor as told. GET HELP RIGHT AWAY IF:   Pain is not controlled with medicine.  Pain continues or gets worse.  The pain changes and there is chest or belly (abdominal) pain.  You pass out (faint).  You cannot pee.  You keep throwing up (vomiting).  You have a temperature by mouth above 102 F (38.9 C), not controlled by medicine. MAKE SURE YOU:   Understand these instructions.  Will watch this condition.  Will get help right away if you are not doing well or get worse. Document Released: 12/19/2007 Document Revised: 09/24/2011 Document Reviewed: 12/19/2007 [facility] Patient Information 2015 [place], [place]. This information is not intended to replace advice given to you by your health care provider. Make sure you discuss any questions you have with your health care provider.  Chronic Diarrhea Diarrhea is frequent loose and watery bowel movements. It can cause you to feel weak and dehydrated. Dehydration can cause you to become tired and thirsty and to have a dry mouth, decreased urination, and dark yellow urine. Diarrhea is a sign of another problem, most often an infection that will not last  long. In most cases, diarrhea lasts 2-3 days. Diarrhea that lasts longer than 4 weeks is called long-lasting (chronic) diarrhea. It is important to treat your diarrhea as directed by your health care provider to lessen or prevent future episodes of diarrhea.  CAUSES  There are many causes of chronic diarrhea. The following are some possible causes:   Gastrointestinal infections caused by viruses, bacteria, or parasites.   Food poisoning or food allergies.   Certain medicines, such as antibiotics, chemotherapy, and laxatives.   Artificial sweeteners and fructose.   Digestive disorders, such as celiac disease and inflammatory bowel diseases.   Irritable bowel syndrome.  Some disorders of the pancreas.  Disorders of the thyroid.  Reduced blood flow to the intestines.  Cancer. Sometimes the cause of chronic diarrhea is unknown. RISK FACTORS  Having a severely weakened immune system, such as from HIV or AIDS.   Taking certain types of cancer-fighting drugs (such as with chemotherapy) or other medicines.   Having had a recent organ transplant.   Having a portion of the stomach or small bowel removed.   Traveling to countries where food and water supplies are often contaminated.  SYMPTOMS  In addition to frequent, loose stools, diarrhea may cause:   Cramping.   Abdominal pain.   Nausea.   Fever.  Fatigue.  Urgent need to use the bathroom.  Loss of bowel control. DIAGNOSIS  Your health care provider must take a careful history and perform a physical exam. Tests given are based  on your symptoms and history. Tests may include:   Blood or stool tests. Three or more stool samples may be examined. Stool cultures may be used to test for bacteria or parasites.   X-rays.   A procedure in which a thin tube is inserted into the mouth or rectum (endoscopy). This allows the health care provider to look inside the intestine.  TREATMENT   Treatment is aimed at  correcting the cause of the diarrhea when possible.  Diarrhea caused by an infection can often be treated with antibiotic medicines.  Diarrhea not caused by an infection may require you to take long-term medicine or have surgery. Specific treatment should be discussed with your health care provider.  If the cause cannot be determined, treatment aims to relieve symptoms and prevent dehydration. Serious health problems can occur if you do not maintain proper fluid levels. Treatment may include:  Taking an oral rehydration solution (ORS).  Not drinking beverages that contain caffeine (such as tea, coffee, and soft drinks).  Not drinking alcohol.  Maintaining well-balanced nutrition to help you recover faster. HOME CARE INSTRUCTIONS   Drink enough fluids to keep urine clear or pale yellow. Drink 1 cup (8 oz) of fluid for each diarrhea episode. Avoid fluids that contain simple sugars, fruit juices, whole milk products, and sodas. Hydrate with an ORS. You may purchase the ORS or prepare it at home by mixing the following ingredients together:   - tsp (1.7-3  mL) table salt.   tsp (3  mL) baking soda.   tsp (1.7 mL) salt substitute containing potassium chloride.  1 tbsp (20 mL) sugar.  4.2 c (1 L) of water.   Certain foods and beverages may increase the speed at which food moves through the gastrointestinal (GI) tract. These foods and beverages should be avoided. They include:  Caffeinated and alcoholic beverages.  High-fiber foods, such as raw fruits and vegetables, nuts, seeds, and whole grain breads and cereals.  Foods and beverages sweetened with sugar alcohols, such as xylitol, sorbitol, and mannitol.   Some foods may be well tolerated and may help thicken stool. These include:  Starchy foods, such as rice, toast, pasta, low-sugar cereal, oatmeal, grits, baked potatoes, crackers, and bagels.  Bananas.  Applesauce.  Add probiotic-rich foods to help increase healthy  bacteria in the GI tract. These include yogurt and fermented milk products.  Wash your hands well after each diarrhea episode.  Only take over-the-counter or prescription medicines as directed by your health care provider.  Take a warm bath to relieve any burning or pain from frequent diarrhea episodes. SEEK MEDICAL CARE IF:   You are not urinating as often.  Your urine is a dark color.  You become very tired or dizzy.  You have severe pain in the abdomen or rectum.  Your have blood or pus in your stools.  Your stools look black and tarry. SEEK IMMEDIATE MEDICAL CARE IF:   You are unable to keep fluids down.  You have persistent vomiting.  You have blood in your stool.  Your stools are black and tarry.  You do not urinate in 6-8 hours, or there is only a small amount of very dark urine.  You have abdominal pain that increases or localizes.  You have weakness, dizziness, confusion, or lightheadedness.  You have a severe headache.  Your diarrhea gets worse or does not get better.  You have a fever or persistent symptoms for more than 2-3 days.  You have a fever and  your symptoms suddenly get worse. MAKE SURE YOU:   Understand these instructions.  Will watch your condition.  Will get help right away if you are not doing well or get worse. Document Released: 09/22/2003 Document Revised: 07/07/2013 Document Reviewed: 12/25/2012 [facility] Patient Information 2015 [place]. This information is not intended to replace advice given to you by your health care provider. Make sure you discuss any questions you have with your health care provider.

## 2014-01-12 NOTE — ED Notes (Signed)
Called CT scan, Pt. Is unable to tolerate the po contrast.    Dr. Fayrene FearingJames is aware.

## 2014-01-12 NOTE — ED Notes (Signed)
Pt. oob to the bathroom, gait steady.  

## 2014-01-12 NOTE — ED Notes (Signed)
Pt presents to department for evaluation of abdominal pain and palpitations. States diarrhea for several weeks, now states diffuse abdominal pain, became worse last night. Also states she felt heart skipping beats last night. 10/10 abdominal pain upon arrival. States nausea/vomiting and diarrhea today. Pt is alert and oriented x4.

## 2014-01-12 NOTE — ED Provider Notes (Signed)
CSN: [id number]     Arrival date & time 01/12/14  1147 History   First MD Initiated Contact with Patient 01/12/14 1547     Chief Complaint  Patient presents with  . Palpitations  . Abdominal Pain     (Consider location/radiation/quality/duration/timing/severity/associated sxs/prior Treatment) HPI [name] is a 68 y.o. female who presents to ED with complaint of abd pain, nausea, vomiting. Patient states she has had issues with abdominal cramping and diarrhea for 10 months now. She states that she has seen primary care doctor and gastroenterologist, Dr. Chales AbrahamsGupta[place] in [place]. Patient states that she is scheduled for an endoscopy in 2 weeks. She states her pain became worse yesterday. She feels like her abdomen is swollen. She has had episodes of nausea and dry heaving this morning. She continues to have bowel movements, but states they're either diarrhea or "small chunks." She is currently taking a medication for her diarrhea. States yesterday she also had some palpitations and chest pain. She took nitroglycerin for it, took 2 tablets and her pain resolved. She states that she believes he might have been radiating from her abdominal pain. Patient also reports recent kidney stone and lithotripsy, states it was performed 3 weeks ago. She continues to see some blood in her urine, she denies any flank pain or dysuria. She denies any fever or chills. She has taken zofran today for her nausea, with no relief. No other complaints.   Past Medical History  Diagnosis Date  . Coronary atherosclerosis of native coronary vessel     Bare metal stent 1995  . Hyperlipidemia   . Costochondritis   . COPD (chronic obstructive pulmonary disease)   . GERD (gastroesophageal reflux disease)   . Tobacco abuse    Past Surgical History  Procedure Laterality Date  . Thyroid surgery    . Abdominal hysterectomy    . Lithotripsy    . Cholecystectomy     Family History  Problem Relation Age of Onset  . Heart  attack Father   . Heart attack Mother    History  Substance Use Topics  . Smoking status: Current Some Day Smoker -- 0.50 packs/day for 50 years    Types: Cigarettes  . Smokeless tobacco: Not on file  . Alcohol Use: No   OB History   Grav Para Term Preterm Abortions TAB SAB Ect Mult Living                 Review of Systems  Constitutional: Positive for fatigue. Negative for fever and chills.  Respiratory: Negative for cough, chest tightness and shortness of breath.   Cardiovascular: Negative for chest pain, palpitations and leg swelling.  Gastrointestinal: Positive for nausea, vomiting, abdominal pain and diarrhea. Negative for blood in stool.  Genitourinary: Negative for dysuria and flank pain.  Musculoskeletal: Negative for arthralgias, myalgias, neck pain and neck stiffness.  Skin: Negative for rash.  Neurological: Positive for weakness. Negative for dizziness and headaches.  All other systems reviewed and are negative.     Allergies  Contrast media  Home Medications   Prior to Admission medications   Medication Sig Start Date End Date Taking? Authorizing Provider  albuterol (PROVENTIL) (2.5 MG/3ML) 0.083% nebulizer solution Take 2.5 mg by nebulization every 6 (six) hours as needed for wheezing.    Historical Provider, MD  aspirin 81 MG tablet Take 1 tablet (81 mg total) by mouth daily. 04/07/13   Kathleene Hazelhristopher D McAlhany, MD  atorvastatin (LIPITOR) 80 MG tablet TAKE ONE TABLET BY  MOUTH ONCE DAILY    Kathleene Hazelhristopher D McAlhany, MD  DALIRESP 500 MCG TABS tablet  11/19/13   Historical Provider, MD  famotidine (PEPCID) 20 MG tablet Take 20 mg by mouth 2 (two) times daily.    Historical Provider, MD  gabapentin (NEURONTIN) 400 MG capsule Take 400 mg by mouth every 8 (eight) hours.    Historical Provider, MD  isosorbide mononitrate (IMDUR) 120 MG 24 hr tablet TAKE ONE TABLET BY MOUTH ONCE DAILY    Kathleene Hazelhristopher D McAlhany, MD  nitroGLYCERIN (NITROSTAT) 0.4 MG SL tablet Place 1 tablet  (0.4 mg total) under the tongue every 5 (five) minutes as needed for chest pain. 12/01/13   Duke SalviaSteven C Klein, MD   BP 141/70  Pulse 80  Temp(Src) 98 F (36.7 C) (Oral)  Resp 20  Ht 5\' 7"  (1.702 m)  Wt 131 lb (59.421 kg)  BMI 20.51 kg/m2  SpO2 98% Physical Exam  Nursing note and vitals reviewed. Constitutional: She appears well-developed and well-nourished. No distress.  HENT:  Head: Normocephalic.  Eyes: Conjunctivae are normal.  Neck: Neck supple.  Cardiovascular: Normal rate, regular rhythm and normal heart sounds.   Pulmonary/Chest: Effort normal and breath sounds normal. No respiratory distress. She has no wheezes. She has no rales.  Abdominal: Soft. Bowel sounds are normal. She exhibits no distension. There is tenderness. There is no rebound and no guarding.  Diffuse tenderness, right CVA tenderness  Musculoskeletal: She exhibits no edema.  Neurological: She is alert.  Skin: Skin is warm and dry.  Psychiatric: She has a normal mood and affect. Her behavior is normal.    ED Course  Procedures (including critical care time) Labs Review Labs Reviewed  CBC WITH DIFFERENTIAL - Abnormal; Notable for the following:    RBC 5.62 (*)    Hemoglobin 16.7 (*)    HCT 50.3 (*)    All other components within normal limits  COMPREHENSIVE METABOLIC PANEL - Abnormal; Notable for the following:    Glucose, Bld 131 (*)    BUN <3 (*)    Alkaline Phosphatase 157 (*)    All other components within normal limits  LIPASE, BLOOD - Abnormal; Notable for the following:    Lipase 89 (*)    All other components within normal limits  I-STAT TROPOININ, ED    Imaging Review Ct Abdomen Pelvis Wo Contrast  01/12/2014   CLINICAL DATA:  Abdominal pain, palpitations, diarrhea for several weeks now with diffuse abdominal pain, worse last night  EXAM: CT ABDOMEN AND PELVIS WITHOUT CONTRAST  TECHNIQUE: Multidetector CT imaging of the abdomen and pelvis was performed following the standard protocol without  IV contrast.  COMPARISON:  12/05/2013  FINDINGS: Visualized portions of the lung bases are clear.  Mild respiratory motion artifact degrades images of the upper abdomen. Liver is normal. Gallbladder not identified. Spleen and pancreas are normal. Mild dilatation of the common bile duct to maximal diameter of 9 mm in the pancreatic head, likely due to prior cholecystectomy.  Adrenal glands are normal. Both kidney shows small nonobstructing calculi measuring less than or equal to 5 mm. On the right, a stone that was recently seen in the renal pelvis, measuring 6 mm, has now migrated to the ureteral pelvic junction. Mild renal pelvis dilatation. No perinephric inflammatory change.  Bladder is normal.  Reproductive organs not identified.  Nonobstructive bowel gas pattern. Mild diverticulosis of the sigmoid colon. No ascites or adenopathy.  No acute musculoskeletal findings.  IMPRESSION: 6 mm stone right ureteropelvic junction  with very mild dilatation of the right renal pelvis. This stone has migrated about 2 cm distally when compared to 12/05/2013.   Electronically Signed   By: Esperanza Heir M.D.   On: 01/12/2014 18:38   Dg Chest 2 View  01/12/2014   CLINICAL DATA:  Palpitations  EXAM: CHEST  2 VIEW  COMPARISON:  Prior radiograph from 08/03/2013  FINDINGS: The cardiac and mediastinal silhouettes are stable in size and contour, and remain within normal limits.  Lungs are hyperinflated with changes related to COPD. No airspace consolidation, pleural effusion, or pulmonary edema is identified. There is no pneumothorax.  No acute osseous abnormality identified.  IMPRESSION: 1. No active cardiopulmonary disease. 2. COPD.   Electronically Signed   By: Rise Mu M.D.   On: 01/12/2014 14:09     EKG Interpretation   Date/Time:  Tuesday January 12 2014 11:49:44 EDT Ventricular Rate:  91 PR Interval:  120 QRS Duration: 70 QT Interval:  354 QTC Calculation: 435 R Axis:   78 Text Interpretation:  Sinus  rhythm with sinus arrhythmia with occasional  Premature ventricular complexes Abnormal ECG Confirmed by Fayrene Fearing  MD, MARK  [phone]) on 01/12/2014 7:38:29 PM      MDM   Final diagnoses:  Kidney stone on right side  Non-intractable vomiting with nausea, vomiting of unspecified type  Diarrhea   Patient here with abdominal pain, nausea, vomiting, chronic diarrhea. Abdomen is diffusely tender, no peritonitis. Will get labs, urinalysis, CT abdomen and pelvis.   7:35 PM CT is negative other than right 6 mm stone in the ureteropelvic junction. Labs and urinalysis otherwise unremarkable. Patient is feeling better but not a medication. She is requesting Phenergan. She does also describe epigastric pain that she describes as burning. She has an endoscopy scheduled in 2 weeks. She is already taking Pepcid for her symptoms. Will add Carafate. Her symptoms are suspicious for gastritis versus peptic ulcer disease. Will send her home on Carafate and Phenergan. She is to followup with her neurologist and with her gastroenterologist as soon as she is able. Patient agreeable to the plan.  Filed Vitals:   01/12/14 1700 01/12/14 1715 01/12/14 1730 01/12/14 1931  BP: 152/69 144/70 135/73 156/78  Pulse: 77 77 75 77  Temp:      TempSrc:      Resp: 20 18 15 14   Height:      Weight:      SpO2: 98% 97% 96% 99%     Tatyana A Kirichenko, PA-C 01/12/14 2350

## 2014-01-19 NOTE — ED Provider Notes (Signed)
Medical screening examination/treatment/procedure(s) were performed by non-physician practitioner and as supervising physician I was immediately available for consultation/collaboration.   EKG Interpretation   Date/Time:  Tuesday January 12 2014 11:49:44 EDT Ventricular Rate:  91 PR Interval:  120 QRS Duration: 70 QT Interval:  354 QTC Calculation: 435 R Axis:   78 Text Interpretation:  Sinus rhythm with sinus arrhythmia with occasional  Premature ventricular complexes Abnormal ECG Confirmed by Fayrene FearingJAMES  MD, MARK  [phone]([id number]) on 01/12/2014 7:38:29 PM        Rolland PorterMark James, MD 01/19/14 [phone]

## 2014-02-12 ENCOUNTER — Other Ambulatory Visit: Payer: Self-pay | Admitting: Urology

## 2014-02-15 ENCOUNTER — Encounter (HOSPITAL_COMMUNITY): Payer: Self-pay | Admitting: Pharmacy Technician

## 2014-02-18 ENCOUNTER — Encounter (HOSPITAL_COMMUNITY): Payer: Self-pay | Admitting: *Deleted

## 2014-02-18 ENCOUNTER — Ambulatory Visit (HOSPITAL_COMMUNITY)
Admission: RE | Admit: 2014-02-18 | Discharge: 2014-02-18 | Disposition: A | Discharge status: Home / Self Care | Payer: Medicare HMO | Source: Ambulatory Visit | Attending: Urology | Admitting: Urology

## 2014-02-18 ENCOUNTER — Ambulatory Visit (HOSPITAL_COMMUNITY): Payer: Medicare HMO

## 2014-02-18 ENCOUNTER — Encounter (HOSPITAL_COMMUNITY)
Admission: RE | Disposition: A | Discharge status: Home / Self Care | Payer: Self-pay | Source: Ambulatory Visit | Attending: Urology

## 2014-02-18 DIAGNOSIS — Z5309 Procedure and treatment not carried out because of other contraindication: Secondary | ICD-10-CM | POA: Insufficient documentation

## 2014-02-18 DIAGNOSIS — E785 Hyperlipidemia, unspecified: Secondary | ICD-10-CM | POA: Diagnosis not present

## 2014-02-18 DIAGNOSIS — I252 Old myocardial infarction: Secondary | ICD-10-CM | POA: Diagnosis not present

## 2014-02-18 DIAGNOSIS — Z79899 Other long term (current) drug therapy: Secondary | ICD-10-CM | POA: Insufficient documentation

## 2014-02-18 DIAGNOSIS — K219 Gastro-esophageal reflux disease without esophagitis: Secondary | ICD-10-CM | POA: Diagnosis not present

## 2014-02-18 DIAGNOSIS — N201 Calculus of ureter: Secondary | ICD-10-CM | POA: Insufficient documentation

## 2014-02-18 DIAGNOSIS — K449 Diaphragmatic hernia without obstruction or gangrene: Secondary | ICD-10-CM | POA: Diagnosis not present

## 2014-02-18 DIAGNOSIS — F17200 Nicotine dependence, unspecified, uncomplicated: Secondary | ICD-10-CM | POA: Insufficient documentation

## 2014-02-18 HISTORY — DX: Personal history of other diseases of the digestive system: Z87.19

## 2014-02-18 HISTORY — PX: EXTRACORPOREAL SHOCK WAVE LITHOTRIPSY: SHX1557

## 2014-02-18 SURGERY — LITHOTRIPSY, ESWL
Anesthesia: LOCAL | Laterality: Right

## 2014-02-18 MED ORDER — CIPROFLOXACIN HCL 500 MG PO TABS
500.0000 mg | ORAL_TABLET | ORAL | Status: AC
  Administered 2014-02-18: 500 mg via ORAL
  Filled 2014-02-18: qty 1

## 2014-02-18 MED ORDER — DIAZEPAM 5 MG PO TABS
10.0000 mg | ORAL_TABLET | ORAL | Status: AC
  Administered 2014-02-18: 10 mg via ORAL
  Filled 2014-02-18: qty 2

## 2014-02-18 MED ORDER — DIPHENHYDRAMINE HCL 25 MG PO CAPS
25.0000 mg | ORAL_CAPSULE | ORAL | Status: AC
  Administered 2014-02-18: 25 mg via ORAL
  Filled 2014-02-18: qty 1

## 2014-02-18 MED ORDER — SODIUM CHLORIDE 0.9 % IV SOLN
INTRAVENOUS | Status: DC
  Administered 2014-02-18: 14:00:00 via INTRAVENOUS

## 2014-02-18 NOTE — Discharge Instructions (Signed)

## 2014-02-18 NOTE — Progress Notes (Signed)
Patient procedure canceled. Will follow up with Dr. Vernie Ammonsttelin next week.

## 2014-02-18 NOTE — H&P (Signed)
Reason For Visit Mrs. Rippetoe is a 68 year old female patient of Dr. Yetta Flock seen in office consultation today for further evaluation of a right UPJ stone.   History of Present Illness She had reported having pelvic discomfort and back pain for 9 months. A CT scan done on 01/12/14 revealed small bilateral renal calculi that were nonobstructing as well as a 6 mm stone with Hounsfield units of ~800 located at the L3 level on the right. She reports that she underwent lithotripsy by Dr. Saddie Benders for stone located in the left kidney. She passed stone fragments but tells me that for 9 months she has been having gross hematuria. It occurs primarily in the morning. It is not associated with any dysuria. She does have pain in her back across both sides.   She also reports that her bladder has dropped but she denies having any difficulty initiating her urinary stream or stress incontinence.   Past Medical History Problems  1. History of esophageal reflux (V12.79) 2. History of hiatal hernia (V12.79) 3. History of hypercholesterolemia (V12.29) 4. History of myocardial infarction (412)  Surgical History Problems  1. History of Gallbladder Surgery 2. History of Hysteroscopy Of Uterus 3. History of Lithotripsy 4. History of Throat Surgery  Current Meds 1. Atorvastatin Calcium 80 MG Oral Tablet;  Therapy: 13May2015 to Recorded 2. Daliresp 500 MCG Oral Tablet;  Therapy: 16Jun2015 to Recorded 3. Famotidine 20 MG Oral Tablet;  Therapy: 07Apr2015 to Recorded 4. Gabapentin 400 MG Oral Capsule;  Therapy: 13Jul2015 to Recorded 5. Isosorbide Mononitrate ER 120 MG Oral Tablet Extended Release 24 Hour;  Therapy: 13May2015 to Recorded 6. Nitrostat 0.4 MG Sublingual Tablet Sublingual;  Therapy: 19May2015 to Recorded 7. Oxycodone-Acetaminophen 5-325 MG Oral Tablet;  Therapy: 01Jul2015 to Recorded 8. Promethazine HCl - 12.5 MG Oral Tablet;  Therapy: (Recorded:31Jul2015) to Recorded 9. Symbicort 160-4.5 MCG/ACT  Inhalation Aerosol;  Therapy: (Recorded:31Jul2015) to Recorded  Allergies Medication  1. No Known Drug Allergies Non-Medication  2. Contrast Dye  Family History Problems  1. Family history of myocardial infarction (V17.3) : Mother 2. Family history of stroke (V17.1) : Father, Mother  Social History Problems    Denied: History of Alcohol use   Caffeine use (V49.89)   Current smoker (305.1)   1/2 ppd   Widower  Review of Systems Genitourinary, constitutional, skin, eye, otolaryngeal, hematologic/lymphatic, cardiovascular, pulmonary, endocrine, musculoskeletal, gastrointestinal, neurological and psychiatric system(s) were reviewed and pertinent findings if present are noted.  Genitourinary: nocturia and hematuria.    Vitals Vital Signs  Height: 5 ft 7 in Weight: 132 lb  BMI Calculated: 20.67 BSA Calculated: 1.69 Blood Pressure: 144 / 80 Temperature: 97.3 F Heart Rate: 94  Physical Exam Constitutional: Well nourished and well developed . No acute distress.   ENT:. The ears and nose are normal in appearance.   Neck: The appearance of the neck is normal and no neck mass is present.   Pulmonary: No respiratory distress and normal respiratory rhythm and effort.   Cardiovascular: Heart rate and rhythm are normal . No peripheral edema.   Abdomen: The abdomen is soft and nontender. No masses are palpated. No CVA tenderness. No hernias are palpable. No hepatosplenomegaly noted.   Lymphatics: The femoral and inguinal nodes are not enlarged or tender.   Skin: Normal skin turgor, no visible rash and no visible skin lesions.   Neuro/Psych:. Mood and affect are appropriate.   Genitourinary:  Chaperone Present: .  Examination of the external genitalia shows normal female external genitalia and no  lesions. The urethra is normal in appearance and not tender. There is no urethral mass. Vaginal exam demonstrates no abnormalities. A cystocele is present. The bladder is non  tender and not distended. The anus is normal on inspection. The perineum is normal on inspection.    Results/Data Urine  COLOR BROWN  APPEARANCE CLOUDY  SPECIFIC GRAVITY 1.025  pH 5.5  GLUCOSE NEG mg/dL BILIRUBIN SMALL  KETONE NEG mg/dL BLOOD LARGE  PROTEIN 30 mg/dL UROBILINOGEN 0.2 mg/dL NITRITE NEG  LEUKOCYTE ESTERASE NEG  SQUAMOUS EPITHELIAL/HPF NONE SEEN  WBC NONE SEEN WBC/hpf RBC TNTC RBC/hpf BACTERIA NONE SEEN  CRYSTALS NONE SEEN  CASTS NONE SEEN   Old records or history reviewed: Notes from Dr. Yetta Flock as above.  The following images/tracing/specimen were independently visualized:  CT scan as above.  The following clinical lab reports were reviewed:  UA: Red cells were noted but the urine was not infected appearing.  The following radiology reports were reviewed: CT scan.    Procedure  Procedure: Cystoscopy done on 02/12/14  Chaperone Present: Sonia.  Indication: Hematuria.  Informed Consent: Risks, benefits, and potential adverse events were discussed and informed consent was obtained from the patient.  Prep: The patient was prepped with hibiclens.  Procedure Note:  Urethral meatus:. No abnormalities.  Anterior urethra: No abnormalities.  Bladder: Visulization was clear. The ureteral orifices were in the normal anatomic position bilaterally. The right ureteral orifice had bloody efflux. The left ureteral orifice had clear efflux of urine. The mucosa was smooth without abnormalities. Multiple stones were identified in the bladder. (There were several small stones on the floor of her bladder) The patient tolerated the procedure well.  Complications: None.    Assessment Assessed  1. Bilateral kidney stones (592.0) 2. Ureteral calculus, right (592.1) 3. Gross hematuria (599.71) 4. Cystocele (618.01) 5. Bladder calculus (594.1)  She has been having gross hematuria for 9 months and being a cigarette smoker who has not had her bladder evaluated previously I felt this  was indicated and proceeded with a cystoscopic evaluation today. The bladder was found to be free of any lesions but she had bloody urine effluxing from her right ureteral orifice where her stone is located.        We discussed the management of urinary stones. These options include observation, ureteroscopy, shockwave lithotripsy, and PCNL. We discussed which options are relevant to these particular stones. We discussed the natural history of stones as well as the complications of untreated stones and the impact on quality of life without treatment as well as with each of the above listed treatments. We also discussed the efficacy of each treatment in its ability to clear the stone burden. With any of these management options I discussed the signs and symptoms of infection and the need for emergent treatment should these be experienced. For each option we discussed the ability of each procedure to clear the patient of their stone burden.    For observation I described the risks which include but are not limited to silent renal damage, life-threatening infection, need for emergent surgery, failure to pass stone, and pain.    For ureteroscopy I described the risks which include heart attack, stroke, pulmonary embolus, death, bleeding, infection, damage to contiguous structures, positioning injury, ureteral stricture, ureteral avulsion, ureteral injury, need for ureteral stent, inability to perform ureteroscopy, need for an interval procedure, inability to clear stone burden, stent discomfort and pain.    For shockwave lithotripsy I described the risks which include arrhythmia, kidney  contusion, kidney hemorrhage, need for transfusion, long-term risk of diabetes or hypertension, back discomfort, flank ecchymosis, flank abrasion, inability to break up stone, inability to pass stone fragments, Steinstrasse, infection associated with obstructing stones, need for different surgical procedure, need for  repeat shockwave lithotripsy, and death.    For PCNL I described the risks including heart attack, sure, pulmonary embolus, death, positioning injury, pneumothorax, hydrothorax, need for chest tube, inability to clear stone burden, renal laceration, arterial venous fistula or malformation, need for embolization of kidney, loss of kidney or renal function, need for repeat procedure, need for prolonged nephrostomy tube, ureteral avulsion, fistula.    I told her that her stone was not of a size that a percutaneous procedure would be required. She has had prior lithotripsy with good result and with the stone having Hounsfield units of 800 it would likely fragment with lithotripsy. She has elected proceed with this.     I did find some bladder calculi today. I could see these on her CT scan and I am surprised she is not passing these in the interim. She may be passing stone fragments intermittently   Plan   1. She will continue tamsulosin for medical expulsive therapy.  2. She is going to be scheduled for lithotripsy of her right ureteral stone.

## 2014-02-24 ENCOUNTER — Other Ambulatory Visit: Payer: Self-pay | Admitting: Urology

## 2014-03-01 ENCOUNTER — Encounter (HOSPITAL_BASED_OUTPATIENT_CLINIC_OR_DEPARTMENT_OTHER): Payer: Self-pay | Admitting: *Deleted

## 2014-03-02 ENCOUNTER — Encounter (HOSPITAL_BASED_OUTPATIENT_CLINIC_OR_DEPARTMENT_OTHER): Payer: Self-pay | Admitting: *Deleted

## 2014-03-02 NOTE — Progress Notes (Signed)
NPO AFTER MN. ARRIVE AT [id number].  NEEDS ISTAT AND KUB.  CURRENT EKG IN CHART AND EPIC. WILL TAKE IMDUR, PEPCID, AND DALIRESP AM DSO W/ SIPS OF WATER AND IF NEEDED TAKE PAIN/ NAUSEA RX MEDS.

## 2014-03-08 ENCOUNTER — Encounter (HOSPITAL_BASED_OUTPATIENT_CLINIC_OR_DEPARTMENT_OTHER)
Admission: RE | Disposition: A | Discharge status: Home / Self Care | Payer: Self-pay | Source: Ambulatory Visit | Attending: Urology

## 2014-03-08 ENCOUNTER — Encounter (HOSPITAL_BASED_OUTPATIENT_CLINIC_OR_DEPARTMENT_OTHER): Payer: Medicare HMO | Admitting: Anesthesiology

## 2014-03-08 ENCOUNTER — Ambulatory Visit (HOSPITAL_BASED_OUTPATIENT_CLINIC_OR_DEPARTMENT_OTHER): Payer: Medicare HMO | Admitting: Anesthesiology

## 2014-03-08 ENCOUNTER — Ambulatory Visit (HOSPITAL_BASED_OUTPATIENT_CLINIC_OR_DEPARTMENT_OTHER)
Admission: RE | Admit: 2014-03-08 | Discharge: 2014-03-08 | Disposition: A | Discharge status: Home / Self Care | Payer: Medicare HMO | Source: Ambulatory Visit | Attending: Urology | Admitting: Urology

## 2014-03-08 ENCOUNTER — Ambulatory Visit (HOSPITAL_COMMUNITY): Payer: Medicare HMO

## 2014-03-08 ENCOUNTER — Encounter (HOSPITAL_BASED_OUTPATIENT_CLINIC_OR_DEPARTMENT_OTHER): Payer: Self-pay | Admitting: *Deleted

## 2014-03-08 DIAGNOSIS — I252 Old myocardial infarction: Secondary | ICD-10-CM | POA: Insufficient documentation

## 2014-03-08 DIAGNOSIS — K219 Gastro-esophageal reflux disease without esophagitis: Secondary | ICD-10-CM | POA: Insufficient documentation

## 2014-03-08 DIAGNOSIS — K449 Diaphragmatic hernia without obstruction or gangrene: Secondary | ICD-10-CM | POA: Insufficient documentation

## 2014-03-08 DIAGNOSIS — N200 Calculus of kidney: Secondary | ICD-10-CM | POA: Diagnosis not present

## 2014-03-08 DIAGNOSIS — Z79899 Other long term (current) drug therapy: Secondary | ICD-10-CM | POA: Diagnosis not present

## 2014-03-08 DIAGNOSIS — F17200 Nicotine dependence, unspecified, uncomplicated: Secondary | ICD-10-CM | POA: Diagnosis not present

## 2014-03-08 DIAGNOSIS — Z91041 Radiographic dye allergy status: Secondary | ICD-10-CM | POA: Insufficient documentation

## 2014-03-08 DIAGNOSIS — J449 Chronic obstructive pulmonary disease, unspecified: Secondary | ICD-10-CM | POA: Insufficient documentation

## 2014-03-08 DIAGNOSIS — E785 Hyperlipidemia, unspecified: Secondary | ICD-10-CM | POA: Diagnosis not present

## 2014-03-08 DIAGNOSIS — Z9861 Coronary angioplasty status: Secondary | ICD-10-CM | POA: Diagnosis not present

## 2014-03-08 DIAGNOSIS — N201 Calculus of ureter: Secondary | ICD-10-CM | POA: Diagnosis not present

## 2014-03-08 DIAGNOSIS — J4489 Other specified chronic obstructive pulmonary disease: Secondary | ICD-10-CM | POA: Insufficient documentation

## 2014-03-08 DIAGNOSIS — I2510 Atherosclerotic heart disease of native coronary artery without angina pectoris: Secondary | ICD-10-CM | POA: Insufficient documentation

## 2014-03-08 HISTORY — DX: Calculus of ureter: N20.1

## 2014-03-08 HISTORY — PX: HOLMIUM LASER APPLICATION: SHX5852

## 2014-03-08 HISTORY — DX: Cough: R05

## 2014-03-08 HISTORY — DX: Old myocardial infarction: I25.2

## 2014-03-08 HISTORY — DX: Dyspnea, unspecified: R06.00

## 2014-03-08 HISTORY — PX: CYSTOSCOPY WITH URETEROSCOPY AND STENT PLACEMENT: SHX6377

## 2014-03-08 HISTORY — DX: Presence of dental prosthetic device (complete) (partial): Z97.2

## 2014-03-08 HISTORY — DX: Other specified cough: R05.8

## 2014-03-08 HISTORY — DX: Restless legs syndrome: G25.81

## 2014-03-08 HISTORY — DX: Personal history of urinary calculi: Z87.442

## 2014-03-08 HISTORY — DX: Unspecified osteoarthritis, unspecified site: M19.90

## 2014-03-08 HISTORY — DX: Hematuria, unspecified: R31.9

## 2014-03-08 HISTORY — DX: Presence of coronary angioplasty implant and graft: Z95.5

## 2014-03-08 HISTORY — DX: Other forms of dyspnea: R06.09

## 2014-03-08 HISTORY — DX: Emphysema, unspecified: J43.9

## 2014-03-08 HISTORY — DX: Simple chronic bronchitis: J41.0

## 2014-03-08 LAB — POCT I-STAT, CHEM 8
BUN: 7 mg/dL (ref 6–23)
Calcium, Ion: 1.21 mmol/L (ref 1.13–1.30)
Chloride: 106 mEq/L (ref 96–112)
Creatinine, Ser: 1 mg/dL (ref 0.50–1.10)
Glucose, Bld: 114 mg/dL — ABNORMAL HIGH (ref 70–99)
HCT: 44 % (ref 36.0–46.0)
Hemoglobin: 15 g/dL (ref 12.0–15.0)
Potassium: 3.2 mEq/L — ABNORMAL LOW (ref 3.7–5.3)
Sodium: 145 mEq/L (ref 137–147)
TCO2: 26 mmol/L (ref 0–100)

## 2014-03-08 SURGERY — CYSTOURETEROSCOPY, WITH STENT INSERTION
Anesthesia: General | Site: Ureter | Laterality: Right

## 2014-03-08 MED ORDER — PROPOFOL 10 MG/ML IV BOLUS
INTRAVENOUS | Status: DC | PRN
  Administered 2014-03-08: 150 mg via INTRAVENOUS

## 2014-03-08 MED ORDER — BELLADONNA ALKALOIDS-OPIUM 16.2-60 MG RE SUPP
RECTAL | Status: AC
  Filled 2014-03-08: qty 1

## 2014-03-08 MED ORDER — TAMSULOSIN HCL 0.4 MG PO CAPS
ORAL_CAPSULE | ORAL | Status: AC
  Filled 2014-03-08: qty 1

## 2014-03-08 MED ORDER — LACTATED RINGERS IV SOLN
INTRAVENOUS | Status: DC
  Administered 2014-03-08: 09:00:00 via INTRAVENOUS
  Filled 2014-03-08: qty 1000

## 2014-03-08 MED ORDER — FENTANYL CITRATE 0.05 MG/ML IJ SOLN
INTRAMUSCULAR | Status: DC | PRN
  Administered 2014-03-08: 25 ug via INTRAVENOUS
  Administered 2014-03-08: 50 ug via INTRAVENOUS
  Administered 2014-03-08: 25 ug via INTRAVENOUS

## 2014-03-08 MED ORDER — OXYCODONE-ACETAMINOPHEN 5-325 MG PO TABS
1.0000 | ORAL_TABLET | ORAL | Status: DC | PRN
  Administered 2014-03-08: 1 via ORAL
  Filled 2014-03-08: qty 2

## 2014-03-08 MED ORDER — CIPROFLOXACIN IN D5W 200 MG/100ML IV SOLN
200.0000 mg | INTRAVENOUS | Status: AC
  Administered 2014-03-08: 200 mg via INTRAVENOUS
  Filled 2014-03-08: qty 100

## 2014-03-08 MED ORDER — ONDANSETRON HCL 4 MG/2ML IJ SOLN
INTRAMUSCULAR | Status: DC | PRN
  Administered 2014-03-08: 4 mg via INTRAVENOUS

## 2014-03-08 MED ORDER — OXYCODONE-ACETAMINOPHEN 10-325 MG PO TABS: 1.0000 | ORAL_TABLET | ORAL | Status: DC | PRN

## 2014-03-08 MED ORDER — MEPERIDINE HCL 25 MG/ML IJ SOLN
6.2500 mg | INTRAMUSCULAR | Status: DC | PRN
  Filled 2014-03-08: qty 1

## 2014-03-08 MED ORDER — PHENAZOPYRIDINE HCL 200 MG PO TABS: 200.0000 mg | ORAL_TABLET | Freq: Three times a day (TID) | ORAL | Status: DC | PRN

## 2014-03-08 MED ORDER — TAMSULOSIN HCL 0.4 MG PO CAPS
0.4000 mg | ORAL_CAPSULE | Freq: Once | ORAL | Status: AC
  Administered 2014-03-08: 0.4 mg via ORAL
  Filled 2014-03-08: qty 1

## 2014-03-08 MED ORDER — LIDOCAINE HCL (CARDIAC) 20 MG/ML IV SOLN
INTRAVENOUS | Status: DC | PRN
  Administered 2014-03-08: 50 mg via INTRAVENOUS

## 2014-03-08 MED ORDER — PROMETHAZINE HCL 25 MG/ML IJ SOLN
6.2500 mg | INTRAMUSCULAR | Status: DC | PRN
  Filled 2014-03-08: qty 1

## 2014-03-08 MED ORDER — PHENAZOPYRIDINE HCL 200 MG PO TABS
200.0000 mg | ORAL_TABLET | Freq: Once | ORAL | Status: AC
  Administered 2014-03-08: 200 mg via ORAL
  Filled 2014-03-08: qty 1

## 2014-03-08 MED ORDER — CIPROFLOXACIN IN D5W 200 MG/100ML IV SOLN
INTRAVENOUS | Status: AC
  Filled 2014-03-08: qty 100

## 2014-03-08 MED ORDER — FENTANYL CITRATE 0.05 MG/ML IJ SOLN
INTRAMUSCULAR | Status: AC
  Filled 2014-03-08: qty 4

## 2014-03-08 MED ORDER — OXYBUTYNIN CHLORIDE 5 MG PO TABS
ORAL_TABLET | ORAL | Status: AC
  Filled 2014-03-08: qty 1

## 2014-03-08 MED ORDER — OXYBUTYNIN CHLORIDE 5 MG PO TABS
5.0000 mg | ORAL_TABLET | Freq: Once | ORAL | Status: AC
  Administered 2014-03-08: 5 mg via ORAL
  Filled 2014-03-08: qty 1

## 2014-03-08 MED ORDER — MIDAZOLAM HCL 2 MG/2ML IJ SOLN
INTRAMUSCULAR | Status: AC
  Filled 2014-03-08: qty 2

## 2014-03-08 MED ORDER — IOHEXOL 350 MG/ML SOLN
INTRAVENOUS | Status: DC | PRN
  Administered 2014-03-08: 6 mL

## 2014-03-08 MED ORDER — FENTANYL CITRATE 0.05 MG/ML IJ SOLN
25.0000 ug | INTRAMUSCULAR | Status: DC | PRN
  Filled 2014-03-08: qty 1

## 2014-03-08 MED ORDER — OXYCODONE-ACETAMINOPHEN 5-325 MG PO TABS
ORAL_TABLET | ORAL | Status: AC
  Filled 2014-03-08: qty 1

## 2014-03-08 MED ORDER — PHENAZOPYRIDINE HCL 100 MG PO TABS
ORAL_TABLET | ORAL | Status: AC
  Filled 2014-03-08: qty 2

## 2014-03-08 MED ORDER — BELLADONNA ALKALOIDS-OPIUM 16.2-60 MG RE SUPP
RECTAL | Status: DC | PRN
  Administered 2014-03-08: 1 via RECTAL

## 2014-03-08 MED ORDER — LABETALOL HCL 5 MG/ML IV SOLN
INTRAVENOUS | Status: DC | PRN
  Administered 2014-03-08: 5 mg via INTRAVENOUS

## 2014-03-08 MED ORDER — DEXAMETHASONE SODIUM PHOSPHATE 4 MG/ML IJ SOLN
INTRAMUSCULAR | Status: DC | PRN
  Administered 2014-03-08: 4 mg via INTRAVENOUS

## 2014-03-08 MED ORDER — SODIUM CHLORIDE 0.9 % IR SOLN
Status: DC | PRN
  Administered 2014-03-08: 4000 mL

## 2014-03-08 SURGICAL SUPPLY — 46 items
ADAPTER CATH URET PLST 4-6FR (CATHETERS) IMPLANT
ADPR CATH URET STRL DISP 4-6FR (CATHETERS)
BAG DRAIN URO-CYSTO SKYTR STRL (DRAIN) ×2 IMPLANT
BAG DRN UROCATH (DRAIN) ×1
BASKET LASER NITINOL 1.9FR (BASKET) IMPLANT
BASKET STNLS GEMINI 4WIRE 3FR (BASKET) IMPLANT
BASKET ZERO TIP NITINOL 2.4FR (BASKET) ×1 IMPLANT
BSKT STON RTRVL 120 1.9FR (BASKET)
BSKT STON RTRVL GEM 120X11 3FR (BASKET)
BSKT STON RTRVL ZERO TP 2.4FR (BASKET) ×1
CANISTER SUCT LVC 12 LTR MEDI- (MISCELLANEOUS) ×1 IMPLANT
CATH INTERMIT  6FR 70CM (CATHETERS) ×1 IMPLANT
CATH URET 5FR 28IN CONE TIP (BALLOONS)
CATH URET 5FR 70CM CONE TIP (BALLOONS) IMPLANT
CLOTH BEACON ORANGE TIMEOUT ST (SAFETY) ×2 IMPLANT
DRAPE CAMERA CLOSED 9X96 (DRAPES) ×2 IMPLANT
ELECT REM PT RETURN 9FT ADLT (ELECTROSURGICAL)
ELECTRODE REM PT RTRN 9FT ADLT (ELECTROSURGICAL) IMPLANT
FIBER LASER FLEXIVA 200 (UROLOGICAL SUPPLIES) IMPLANT
FIBER LASER FLEXIVA 365 (UROLOGICAL SUPPLIES) IMPLANT
FIBER LASER FLEXIVA 550 (UROLOGICAL SUPPLIES) IMPLANT
FIBER LASER TRAC TIP (UROLOGICAL SUPPLIES) ×1 IMPLANT
GLOVE BIO SURGEON STRL SZ 6 (GLOVE) ×1 IMPLANT
GLOVE BIO SURGEON STRL SZ8 (GLOVE) ×2 IMPLANT
GLOVE INDICATOR 6.5 STRL GRN (GLOVE) ×1 IMPLANT
GOWN PREVENTION PLUS LG XLONG (DISPOSABLE) ×1 IMPLANT
GOWN STRL REIN XL XLG (GOWN DISPOSABLE) ×1 IMPLANT
GOWN STRL REUS W/ TWL LRG LVL3 (GOWN DISPOSABLE) IMPLANT
GOWN STRL REUS W/TWL LRG LVL3 (GOWN DISPOSABLE)
GOWN STRL REUS W/TWL XL LVL3 (GOWN DISPOSABLE) ×1 IMPLANT
GUIDEWIRE 0.038 PTFE COATED (WIRE) IMPLANT
GUIDEWIRE ANG ZIPWIRE 038X150 (WIRE) IMPLANT
GUIDEWIRE STR DUAL SENSOR (WIRE) ×2 IMPLANT
IV NS IRRIG 3000ML ARTHROMATIC (IV SOLUTION) ×3 IMPLANT
KIT BALLIN UROMAX 15FX10 (LABEL) IMPLANT
KIT BALLN UROMAX 15FX4 (MISCELLANEOUS) IMPLANT
KIT BALLN UROMAX 26 75X4 (MISCELLANEOUS)
PACK CYSTOSCOPY (CUSTOM PROCEDURE TRAY) ×2 IMPLANT
SET HIGH PRES BAL DIL (LABEL)
SHEATH ACCESS URETERAL 24CM (SHEATH) ×1 IMPLANT
SHEATH ACCESS URETERAL 38CM (SHEATH) IMPLANT
SHEATH ACCESS URETERAL 54CM (SHEATH) IMPLANT
SHEATH URET ACCESS 12FR/35CM (UROLOGICAL SUPPLIES) IMPLANT
SHEATH URET ACCESS 12FR/55CM (UROLOGICAL SUPPLIES) IMPLANT
STENT PERCUFLEX 4.8FRX24 (STENTS) ×1 IMPLANT
WATER STERILE IRR 3000ML UROMA (IV SOLUTION) IMPLANT

## 2014-03-08 NOTE — H&P (Signed)
[name] is a 68 year old female with a history of a right ureteral calculus.   History of Present Illness Calculus disease: She had reported having pelvic discomfort and back pain for 9 months. A CT scan done on 01/12/14 revealed small bilateral renal calculi that were nonobstructing as well as a 6 mm stone with Hounsfield units of ~800 located at the L3 level on the right. She reports that she underwent lithotripsy by Dr. Saddie Benders for stone located in the left kidney.     Gross hematuria: She indicated having gross hematuria for 9 months primarily in the morning and not associated with any dysuria. Her CT scan revealed no renal lesions but a 6 mm right ureteral stone was identified. Cystoscopic evaluation was negative.     Cystocele: She reported that her "bladder has dropped" but she denies having any difficulty initiating her urinary stream or stress incontinence.    Interval history: We had decided to proceed with lithotripsy of right ureteral stone but the stone could not be visualized at the time of her lithotripsy treatment and because she has had a contrast allergy in the past she could not receive contrast to aid in identification of the location of her stone. She had not seen her stone passed but had been having discomfort although no stone could be identified on her preoperative KUB. She has come in today for a repeat CT scan to evaluate for the continued presence of her right ureteral stone.   Past Medical History Problems  1. History of Bladder calculus (594.1) 2. History of esophageal reflux (V12.79) 3. History of hiatal hernia (V12.79) 4. History of hypercholesterolemia (V12.29) 5. History of myocardial infarction (412)  Surgical History Problems  1. History of Gallbladder Surgery 2. History of Hysteroscopy Of Uterus 3. History of Lithotripsy 4. History of Throat Surgery  Current Meds 1. Atorvastatin Calcium 80 MG Oral Tablet;  Therapy: 13May2015 to Recorded 2.  Daliresp 500 MCG Oral Tablet;  Therapy: 16Jun2015 to Recorded 3. Famotidine 20 MG Oral Tablet;  Therapy: 07Apr2015 to Recorded 4. Gabapentin 400 MG Oral Capsule;  Therapy: 13Jul2015 to Recorded 5. Isosorbide Mononitrate ER 120 MG Oral Tablet Extended Release 24 Hour;  Therapy: 13May2015 to Recorded 6. Nitrostat 0.4 MG Sublingual Tablet Sublingual;  Therapy: 19May2015 to Recorded 7. Oxycodone-Acetaminophen 5-325 MG Oral Tablet;  Therapy: 01Jul2015 to Recorded 8. Promethazine HCl - 12.5 MG Oral Tablet;  Therapy: (Recorded:31Jul2015) to Recorded 9. Symbicort 160-4.5 MCG/ACT Inhalation Aerosol;  Therapy: (Recorded:31Jul2015) to Recorded  Allergies Medication  1. No Known Drug Allergies Non-Medication  2. Contrast Dye  Family History Problems  1. Family history of myocardial infarction (V17.3) : Mother 2. Family history of stroke (V17.1) : Father, Mother  Social History Problems  1. Denied: History of Alcohol use 2. Caffeine use (V49.89) 3. Current smoker (305.1)   1/2 ppd 4. Widower  Review of Systems Genitourinary, constitutional, skin, eye, otolaryngeal, hematologic/lymphatic, cardiovascular, pulmonary, endocrine, musculoskeletal, gastrointestinal, neurological and psychiatric system(s) were reviewed and pertinent findings if present are noted.  Genitourinary: nocturia and hematuria.    Vitals Vital Signs  Height: 5 ft 7 in Weight: 132 lb  BMI Calculated: 20.67 BSA Calculated: 1.69 Blood Pressure: 144 / 80 Temperature: 97.3 F Heart Rate: 94  Physical Exam Constitutional: Well nourished and well developed . No acute distress.   ENT:. The ears and nose are normal in appearance.   Neck: The appearance of the neck is normal and no neck mass is present.   Pulmonary: No respiratory distress  and normal respiratory rhythm and effort.   Cardiovascular: Heart rate and rhythm are normal . No peripheral edema.   Abdomen: The abdomen is soft and nontender. No masses are  palpated. No CVA tenderness. No hernias are palpable. No hepatosplenomegaly noted.   Lymphatics: The femoral and inguinal nodes are not enlarged or tender.   Skin: Normal skin turgor, no visible rash and no visible skin lesions.   Neuro/Psych:. Mood and affect are appropriate.   Genitourinary:  Chaperone Present: .  Examination of the external genitalia shows normal female external genitalia and no lesions. The urethra is normal in appearance and not tender. There is no urethral mass. Vaginal exam demonstrates no abnormalities. A cystocele is present. The bladder is non tender and not distended. The anus is normal on inspection. The perineum is normal on inspection.   AU CT-STONE PROTOCOL 10Aug2015 12:00AM Ihor Gully  Test Name Result Flag Reference CT-STONE PROTOCOL (Report)   ** RADIOLOGY REPORT BY [facility] **   CLINICAL DATA: Micro hematuria, low back pain.  EXAM: CT ABDOMEN AND PELVIS WITHOUT CONTRAST (URINARY CALCULUS PROTOCOL)  TECHNIQUE: Multidetector CT imaging was performed through the abdomen and pelvis without intravenous contrast to include the urinary tract.  COMPARISON: 01/12/2014  FINDINGS: Lung bases are clear. No effusions.  Bilateral nonobstructing renal stones are again noted, stable. Two mid right ureteral stones noted, the largest 5 mm on image 37 just above the pelvic brim. Just proximal to this stone is a second stone measuring 2 mm. Mild fullness of the right ureter without overt hydronephrosis. Urinary bladder is decompressed.  Prior hysterectomy. No adnexal masses. Stomach, large and small bowel are unremarkable. Aorta and iliac vessels are heavily calcified, non aneurysmal.  Visualized liver has unremarkable unenhanced appearance. The liver is not imaged in its entirety. Spleen, pancreas adrenals have an unremarkable unenhanced appearance. No free fluid, free air or adenopathy.  No acute bony abnormality. Degenerative  changes in the lumbar spine.  IMPRESSION: Migration of the previously seen 5 mm right UPJ stone to the level of the pelvic brim in the mid right ureter. Second 2 mm right ureteral stone now also present.  Bilateral nephrolithiasis.   Electronically Signed  By: Charlett Nose M.D.  On: 02/22/2014 14:29  Assessment Assessed    This stone is located in the ureter over the iliac bone which is why it could not be seen at the time of lithotripsy. I therefore discussed proceeding with ureteroscopy and laser lithotripsy to treat both of her right ureteral stones. She had a 5 mm stone and just proximal to that a 2 mm stone as well as bilateral renal calculi on her CT scan.  We therefore discussed using ureteroscopy to manage her stone. I went over the procedure with her in detail and have described how this was performed, the risks and complications, the probability of success and the anticipated postoperative course. She has had a stent previously with a string. She would prefer that if possible.    She did have some bladder calculi when I perform cystoscopy in the office but those are no longer present on her CT scan.     Although her gross hematuria is almost certainly from her stones I will evaluate for any hematuria from the left orifice as well.   Plan   She will be scheduled for right ureteroscopy and laser lithotripsy of her right ureteral calculi with a tethered stent if necessary.

## 2014-03-08 NOTE — Anesthesia Procedure Notes (Signed)
Procedure Name: LMA Insertion Date/Time: 03/08/2014 10:32 AM Performed by: Maris Berger T Pre-anesthesia Checklist: Patient identified, Emergency Drugs available, Suction available and Patient being monitored Patient Re-evaluated:Patient Re-evaluated prior to inductionOxygen Delivery Method: Circle System Utilized Preoxygenation: Pre-oxygenation with 100% oxygen Intubation Type: IV induction Ventilation: Mask ventilation without difficulty LMA: LMA inserted LMA Size: 4.0 Number of attempts: 1 Airway Equipment and Method: bite block Placement Confirmation: positive ETCO2 Tube secured with: Tape Dental Injury: Teeth and Oropharynx as per pre-operative assessment

## 2014-03-08 NOTE — Anesthesia Preprocedure Evaluation (Addendum)
Anesthesia Evaluation  Patient identified by MRN, date of birth, ID band Patient awake    Reviewed: Allergy & Precautions, H&P , NPO status , Patient's Chart, lab work & pertinent test results  Airway Mallampati: II TM Distance: >3 FB Neck ROM: Full    Dental no notable dental hx.    Pulmonary COPD COPD inhaler, Current Smoker,  breath sounds clear to auscultation  Pulmonary exam normal       Cardiovascular + CAD and + Cardiac Stents (1995) Rhythm:Regular Rate:Normal     Neuro/Psych negative neurological ROS  negative psych ROS   GI/Hepatic Neg liver ROS, hiatal hernia, GERD-  Medicated and Controlled,  Endo/Other  negative endocrine ROS  Renal/GU negative Renal ROS  negative genitourinary   Musculoskeletal negative musculoskeletal ROS (+)   Abdominal   Peds negative pediatric ROS (+)  Hematology negative hematology ROS (+)   Anesthesia Other Findings   Reproductive/Obstetrics negative OB ROS                          Anesthesia Physical Anesthesia Plan  ASA: III  Anesthesia Plan: General   Post-op Pain Management:    Induction: Intravenous  Airway Management Planned: LMA  Additional Equipment:   Intra-op Plan:   Post-operative Plan: Extubation in OR  Informed Consent: I have reviewed the patients History and Physical, chart, labs and discussed the procedure including the risks, benefits and alternatives for the proposed anesthesia with the patient or authorized representative who has indicated his/her understanding and acceptance.   Dental advisory given  Plan Discussed with: CRNA  Anesthesia Plan Comments:         Anesthesia Quick Evaluation

## 2014-03-08 NOTE — Discharge Instructions (Signed)
Post stone removal/stent placement surgery instructions ° ° °Definitions: ° °Ureter: The duct that transports urine from the kidney to the bladder. °Stent: A plastic hollow tube that is placed into the ureter, from the kidney to the bladder to prevent the ureter from swelling shut. ° °General instructions: ° °Despite the fact that no skin incisions were used, the area around the ureter and bladder is raw and irritated. The stent is a foreign body which will further irritate the bladder wall. This irritation is manifested by increased frequency of urination, both day and night, and by an increase in the urge to urinate. In some, the urge to urinate is present almost always. Sometimes the urge is strong enough that you may not be able to stop your self from urinating. The only real cure is to remove the stent and then give time for the bladder wall to heal which can't be done until the danger of the ureter swelling shut has passed. (This varies from 2-21 days). ° °You may see some blood in your urine while the stent is in place and a few days afterward. Do not be alarmed, even if the urine is clear for a while. Get off your feet and drink lots of fluids until clearing occurs. If you start to pass clots or don't improve, call us. ° °If you have a string coming from your urethra:  The stent string is attached to your ureteral stent.  Do not pull on thisIf you have a string coming from your urethra:  The stent string is attached to your ureteral stent.  Do not pull on this. ° °Diet: ° °You may return to your normal diet immediately. Because of the raw surface of your bladder, alcohol, spicy foods, foods high in acid and drinks with caffeine may cause irritation or frequency and should be used in moderation. To keep your urine flowing freely and avoid constipation, drink plenty of fluids during the day (8-10 glasses). Tip: Avoid cranberry juice because it is very acidic. ° °Activity: ° °Your physical activity doesn't need  to be restricted. However, if you are very active, you may see some blood in the urine. We suggest that you reduce your activity under the circumstances until the bleeding has stopped. ° °Bowels: ° °It is important to keep your bowels regular during the postoperative period. Straining with bowel movements can cause bleeding. A bowel movement every other day is reasonable. Use a mild laxative if needed, such as milk of magnesia 2-3 tablespoons, or 2 Dulcolax tablets. Call if you continue to have problems. If you had been taking narcotics for pain, before, during or after your surgery, you may be constipated. Take a laxative if necessary. ° ° ° ° °Medication: ° °You should resume your pre-surgery medications unless told not to. DO NOT RESUME YOUR ASPIRIN, or any other medicines like ibuprofen, motrin, excedrin, advil, aleve, vitamin E, fish oil as these can all cause bleeding x 7 days. In addition you may be given an antibiotic to prevent or treat infection. Antibiotics are not always necessary. All medication should be taken as prescribed until the bottles are finished unless you are having an unusual reaction to one of the drugs. ° °Problems you should report to us: ° °a. Fever greater than 101°F. °b. Heavy bleeding, or clots (see notes above about blood in urine). °c. Inability to urinate. °d. Drug reactions (hives, rash, nausea, vomiting, diarrhea). °e. Severe burning or pain with urination that is not improving. ° °Followup: ° °  You will need a followup appointment to monitor your progress in most cases. Please call the office for this appointment when you get home if your appointment has not already been scheduled. Usually the first appointment will be about 5-14 days after your surgery and if you have a stent in place it will likely be removed at that time. ° °Post Anesthesia Home Care Instructions ° °Activity: °Get plenty of rest for the remainder of the day. A responsible adult should stay with you for 24  hours following the procedure.  °For the next 24 hours, DO NOT: °-Drive a car °-Operate machinery °-Drink alcoholic beverages °-Take any medication unless instructed by your physician °-Make any legal decisions or sign important papers. ° °Meals: °Start with liquid foods such as gelatin or soup. Progress to regular foods as tolerated. Avoid greasy, spicy, heavy foods. If nausea and/or vomiting occur, drink only clear liquids until the nausea and/or vomiting subsides. Call your physician if vomiting continues. ° °Special Instructions/Symptoms: °Your throat may feel dry or sore from the anesthesia or the breathing tube placed in your throat during surgery. If this causes discomfort, gargle with warm salt water. The discomfort should disappear within 24 hours. ° ° °

## 2014-03-08 NOTE — Transfer of Care (Signed)
Immediate Anesthesia Transfer of Care Note  Patient: [name]  Procedure(s) Performed: Procedure(s): RIGHT URETEROSCOPY AND STENT PLACEMENT WITH DILATION OF INFANDIBULAR STENOSIS (Right) HOLMIUM LASER LITHO (Right)  Patient Location: PACU  Anesthesia Type:General  Level of Consciousness: awake, alert  and oriented  Airway & Oxygen Therapy: Patient Spontanous Breathing and Patient connected to nasal cannula oxygen  Post-op Assessment: Report given to PACU RN  Post vital signs: Reviewed and stable  Complications: No apparent anesthesia complications

## 2014-03-08 NOTE — Anesthesia Postprocedure Evaluation (Signed)
  Anesthesia Post-op Note  Patient: [name]  Procedure(s) Performed: Procedure(s) (LRB): RIGHT URETEROSCOPY AND STENT PLACEMENT WITH DILATION OF INFANDIBULAR STENOSIS (Right) HOLMIUM LASER LITHO (Right)  Patient Location: PACU  Anesthesia Type: General  Level of Consciousness: awake and alert   Airway and Oxygen Therapy: Patient Spontanous Breathing  Post-op Pain: mild  Post-op Assessment: Post-op Vital signs reviewed, Patient's Cardiovascular Status Stable, Respiratory Function Stable, Patent Airway and No signs of Nausea or vomiting  Last Vitals:  Filed Vitals:   03/08/14 1200  BP: 146/71  Pulse: 74  Temp:   Resp: 14    Post-op Vital Signs: stable   Complications: No apparent anesthesia complications

## 2014-03-08 NOTE — Op Note (Signed)
PATIENT:  Vanessa [name]  PRE-OPERATIVE DIAGNOSIS: 1. right Ureteral calculus 2. Right renal calculi   POST-OPERATIVE DIAGNOSIS: 1. Right ureteral calculus 2. Right renal calculi 3. Right MID Pole infundibular stenosis  PROCEDURE:  1. Cystoscopy with right retrograde pyelogram including interpretation. 2. Right ureteroscopy with laser lithotripsy. 3. Right ureteral stone extraction. 4. Right renal stone extraction. 5. Dilation of MID Pole infundibular stenosis.  SURGEON: Garnett Farm, MD  INDICATION: Vanessa [name] is a 68 year old female who is having flank pain and has a history of kidney [name]. She was found to have a 5 mm stone located at the L3 level on the right-hand side by CT scan and had been having gross hematuria she described for 9 months. I initially attempted to treat the stone with lithotripsy but was unsuccessful due to the inability to visualize the stone. She is therefore brought to the operating room for treatment of her right ureteral stone. In addition there is a second right ureteral stone that was 2 mm just proximal to the stone. Bilateral nonobstructing renal [name] were noted on her CT scan as well. There was no dilatation of any of the calyces identified on her CT scan.  ANESTHESIA:  General  EBL:  Minimal  DRAINS: 4.8 French, 24 cm double-J stent on the right (with string)  SPECIMEN:  Stone taken to the office for compositional analysis.  DESCRIPTION OF PROCEDURE: The patient was taken to the major OR and placed on the table. General anesthesia was administered and then the patient was moved to the dorsal lithotomy position. The genitalia was sterilely prepped and draped. An official timeout was performed.  Initially the 22 French cystoscope with 12 lens was passed under direct vision. The bladder was then entered and fully inspected. It was noted be free of any tumors [name] or inflammatory lesions. Ureteral orifices were of normal configuration and  position. A 6 French open-ended ureteral catheter was then passed through the cystoscope into the ureteral orifice in order to perform a right retrograde pyelogram.  A retrograde pyelogram was performed by injecting full-strength contrast up the right ureter under direct fluoroscopic control. It revealed a filling defect in the ureter consistent with the stone seen on the preoperative KUB. The remainder of the ureter was noted to be normal although it was slightly dilated and there was some tortuosity noted at the level of the UPJ but it was nonobstructing. The intrarenal collecting system appeared normal. I then passed a 0.038 inch floppy-tipped guidewire through the open ended catheter and into the area of the renal pelvis and this was left in place. The inner portion of a ureteral access sheath was then passed over the guidewire to gently dilate the intramural ureter. I then proceeded with ureteroscopy.  A 6 French rigid ureteroscope was then passed under direct into the bladder and into the right orifice and up the ureter. The stone was identified and I felt it was too large to extract and therefore elected to proceed with laser lithotripsy. The 200  holmium laser fiber was used to fragment the stone. I then used the nitinol basket to extract all of the stone fragments and reinspection of the ureter ureteroscopically revealed no further stone fragments and no injury to the ureter however I was not sure if the 2 mm stone was fragmented or had floated back up into the kidney and I therefore elected to evaluate for this with flexible ureteroscopy. I therefore passed the guidewire through the rigid ureteroscope and into  the area the renal pelvis. I then passed a ureteral access sheath over the guidewire and removed the inner portion of the access sheath as well as the guidewire. The flexible, digital ureteroscope was then passed through the access sheath and into the area of the right renal pelvis. I then  systematically inspected each of the calyces. I found Randall's plaques present in the upper pole. I also found [name] in the upper pole and therefore grasped these with the Nitinol basket and remove them. As I inspected the renal pelvis I found an area that appeared to be a stenosed infundibulum with associated scar tissue surrounding this. I was able to pass the scope up to the opening and see a renal papilla but I was not sure if the stone could possibly be present. I therefore elected to dilate the area of infundibular stenosis.  I used the flexible ureteroscope and with manipulation of the scope I was able to open up the infundibular stenosis quite nicely. A before and after photograph was taken. I then inspected the calyx and found no [name]. The lower pole calyces were also inspected and noted be free of any [name]. I then backed the scope on down the ureter and removed the access sheath simultaneously. I came across the 2 mm stone that was located proximal to the 5 mm stone in her ureter and was able to grasp this with the Nitinol basket and removed at as well. I then reinserted the guidewire through the ureteroscope which had been passed back up through the access sheath and with the guidewire positioned in the renal pelvis the access sheath and ureteroscope were then backed down the ureter under direct visualization and I noted no other [name].   I then backloaded the cystoscope over the guidewire and passed the stent over the guidewire into the area of the renal pelvis. As the guidewire was removed good curl was noted in the renal pelvis. The bladder was drained and the cystoscope was then removed. The patient tolerated the procedure well no intraoperative complications. The tether was left affixed to the distal aspect of the stent.  PLAN OF CARE: Discharge to home after PACU  PATIENT DISPOSITION:  PACU - hemodynamically stable.

## 2014-03-08 NOTE — Addendum Note (Signed)
Addendum created 03/08/14 1243 by Briant Sites, CRNA   Modules edited: Charges VN

## 2014-03-09 ENCOUNTER — Encounter (HOSPITAL_BASED_OUTPATIENT_CLINIC_OR_DEPARTMENT_OTHER): Payer: Self-pay | Admitting: Urology

## 2014-04-15 DIAGNOSIS — N200 Calculus of kidney: Secondary | ICD-10-CM

## 2014-04-15 HISTORY — DX: Calculus of kidney: N20.0

## 2014-05-04 ENCOUNTER — Other Ambulatory Visit: Payer: Self-pay | Admitting: Urology

## 2014-05-06 ENCOUNTER — Emergency Department (HOSPITAL_COMMUNITY): Payer: Medicare HMO

## 2014-05-06 ENCOUNTER — Encounter (HOSPITAL_COMMUNITY): Payer: Self-pay | Admitting: Emergency Medicine

## 2014-05-06 ENCOUNTER — Emergency Department (HOSPITAL_COMMUNITY)
Admission: EM | Admit: 2014-05-06 | Discharge: 2014-05-06 | Disposition: A | Discharge status: Home / Self Care | Payer: Medicare HMO | Attending: Emergency Medicine | Admitting: Emergency Medicine

## 2014-05-06 DIAGNOSIS — Z79899 Other long term (current) drug therapy: Secondary | ICD-10-CM | POA: Diagnosis not present

## 2014-05-06 DIAGNOSIS — I252 Old myocardial infarction: Secondary | ICD-10-CM | POA: Insufficient documentation

## 2014-05-06 DIAGNOSIS — M1990 Unspecified osteoarthritis, unspecified site: Secondary | ICD-10-CM | POA: Diagnosis not present

## 2014-05-06 DIAGNOSIS — Z7982 Long term (current) use of aspirin: Secondary | ICD-10-CM | POA: Insufficient documentation

## 2014-05-06 DIAGNOSIS — Z720 Tobacco use: Secondary | ICD-10-CM | POA: Diagnosis not present

## 2014-05-06 DIAGNOSIS — Z87442 Personal history of urinary calculi: Secondary | ICD-10-CM | POA: Diagnosis not present

## 2014-05-06 DIAGNOSIS — Z9889 Other specified postprocedural states: Secondary | ICD-10-CM | POA: Diagnosis not present

## 2014-05-06 DIAGNOSIS — M545 Low back pain: Secondary | ICD-10-CM | POA: Diagnosis present

## 2014-05-06 DIAGNOSIS — Z972 Presence of dental prosthetic device (complete) (partial): Secondary | ICD-10-CM | POA: Insufficient documentation

## 2014-05-06 DIAGNOSIS — E785 Hyperlipidemia, unspecified: Secondary | ICD-10-CM | POA: Insufficient documentation

## 2014-05-06 DIAGNOSIS — G8929 Other chronic pain: Secondary | ICD-10-CM | POA: Insufficient documentation

## 2014-05-06 DIAGNOSIS — M5441 Lumbago with sciatica, right side: Secondary | ICD-10-CM

## 2014-05-06 DIAGNOSIS — J449 Chronic obstructive pulmonary disease, unspecified: Secondary | ICD-10-CM | POA: Insufficient documentation

## 2014-05-06 DIAGNOSIS — N189 Chronic kidney disease, unspecified: Secondary | ICD-10-CM | POA: Diagnosis not present

## 2014-05-06 DIAGNOSIS — K219 Gastro-esophageal reflux disease without esophagitis: Secondary | ICD-10-CM | POA: Diagnosis not present

## 2014-05-06 DIAGNOSIS — R110 Nausea: Secondary | ICD-10-CM | POA: Diagnosis not present

## 2014-05-06 DIAGNOSIS — Z955 Presence of coronary angioplasty implant and graft: Secondary | ICD-10-CM | POA: Insufficient documentation

## 2014-05-06 MED ORDER — HYDROMORPHONE HCL 1 MG/ML IJ SOLN
1.0000 mg | Freq: Once | INTRAMUSCULAR | Status: AC
  Administered 2014-05-06: 1 mg via INTRAMUSCULAR
  Filled 2014-05-06: qty 1

## 2014-05-06 MED ORDER — KETOROLAC TROMETHAMINE 15 MG/ML IJ SOLN
15.0000 mg | Freq: Once | INTRAMUSCULAR | Status: AC
  Administered 2014-05-06: 15 mg via INTRAMUSCULAR
  Filled 2014-05-06: qty 1

## 2014-05-06 MED ORDER — DIAZEPAM 5 MG PO TABS: 2.5000 mg | ORAL_TABLET | Freq: Three times a day (TID) | ORAL | Status: DC | PRN

## 2014-05-06 MED ORDER — DEXAMETHASONE 4 MG PO TABS: 4.0000 mg | ORAL_TABLET | Freq: Two times a day (BID) | ORAL | Status: DC

## 2014-05-06 MED ORDER — LORAZEPAM 0.5 MG PO TABS
0.5000 mg | ORAL_TABLET | Freq: Once | ORAL | Status: AC
  Administered 2014-05-06: 0.5 mg via ORAL
  Filled 2014-05-06: qty 1

## 2014-05-06 MED ORDER — ONDANSETRON 4 MG PO TBDP
4.0000 mg | ORAL_TABLET | Freq: Once | ORAL | Status: AC
  Administered 2014-05-06: 4 mg via ORAL
  Filled 2014-05-06: qty 1

## 2014-05-06 MED ORDER — IBUPROFEN 600 MG PO TABS: 600.0000 mg | ORAL_TABLET | Freq: Four times a day (QID) | ORAL | Status: DC | PRN

## 2014-05-06 MED ORDER — DEXAMETHASONE 4 MG PO TABS
8.0000 mg | ORAL_TABLET | Freq: Once | ORAL | Status: AC
  Administered 2014-05-06: 8 mg via ORAL
  Filled 2014-05-06: qty 2

## 2014-05-06 MED ORDER — HYDROCODONE-ACETAMINOPHEN 5-325 MG PO TABS: 1.0000 | ORAL_TABLET | ORAL | Status: DC | PRN

## 2014-05-06 NOTE — Discharge Instructions (Signed)
Back Pain, Adult °Back pain is very common. The pain often gets better over time. The cause of back pain is usually not dangerous. Most people can learn to manage their back pain on their own.  °HOME CARE  °· Stay active. Start with short walks on flat ground if you can. Try to walk farther each day. °· Do not sit, drive, or stand in one place for more than 30 minutes. Do not stay in bed. °· Do not avoid exercise or work. Activity can help your back heal faster. °· Be careful when you bend or lift an object. Bend at your knees, keep the object close to you, and do not twist. °· Sleep on a firm mattress. Lie on your side, and bend your knees. If you lie on your back, put a pillow under your knees. °· Only take medicines as told by your doctor. °· Put ice on the injured area. °¨ Put ice in a plastic bag. °¨ Place a towel between your skin and the bag. °¨ Leave the ice on for 15-20 minutes, 03-04 times a day for the first 2 to 3 days. After that, you can switch between ice and heat packs. °· Ask your doctor about back exercises or massage. °· Avoid feeling anxious or stressed. Find good ways to deal with stress, such as exercise. °GET HELP RIGHT AWAY IF:  °· Your pain does not go away with rest or medicine. °· Your pain does not go away in 1 week. °· You have new problems. °· You do not feel well. °· The pain spreads into your legs. °· You cannot control when you poop (bowel movement) or pee (urinate). °· Your arms or legs feel weak or lose feeling (numbness). °· You feel sick to your stomach (nauseous) or throw up (vomit). °· You have belly (abdominal) pain. °· You feel like you may pass out (faint). °MAKE SURE YOU:  °· Understand these instructions. °· Will watch your condition. °· Will get help right away if you are not doing well or get worse. °Document Released: 12/19/2007 Document Revised: 09/24/2011 Document Reviewed: 11/03/2013 °[employer]® Patient Information ©2015 [employer]. This information is not intended  to replace advice given to you by your health care provider. Make sure you discuss any questions you have with your health care provider. ° °

## 2014-05-06 NOTE — ED Notes (Signed)
Pt c/o lower back pain that started months ago that she thought was due to kidney stones but when pt saw urology last week she was told that wasn't kidney stones. Pt states that back pain runs down her legs to her feet.  Pt states that pain is worse on right leg than left leg pain. Pt also states that she hasnt been able to eat due to being nauseated.

## 2014-05-07 ENCOUNTER — Encounter (HOSPITAL_COMMUNITY): Payer: Self-pay | Admitting: *Deleted

## 2014-05-07 NOTE — Progress Notes (Signed)
Patient positive History for Heart Disease however patient denies any chest pain within the last 30 days.

## 2014-05-10 ENCOUNTER — Emergency Department (HOSPITAL_COMMUNITY): Payer: Medicare HMO

## 2014-05-10 ENCOUNTER — Emergency Department (HOSPITAL_COMMUNITY)
Admission: EM | Admit: 2014-05-10 | Discharge: 2014-05-10 | Disposition: A | Discharge status: Home / Self Care | Payer: Medicare HMO | Attending: Emergency Medicine | Admitting: Emergency Medicine

## 2014-05-10 ENCOUNTER — Encounter (HOSPITAL_COMMUNITY): Payer: Self-pay | Admitting: Emergency Medicine

## 2014-05-10 DIAGNOSIS — N189 Chronic kidney disease, unspecified: Secondary | ICD-10-CM | POA: Insufficient documentation

## 2014-05-10 DIAGNOSIS — Z87442 Personal history of urinary calculi: Secondary | ICD-10-CM | POA: Diagnosis not present

## 2014-05-10 DIAGNOSIS — R079 Chest pain, unspecified: Secondary | ICD-10-CM | POA: Insufficient documentation

## 2014-05-10 DIAGNOSIS — Z972 Presence of dental prosthetic device (complete) (partial): Secondary | ICD-10-CM | POA: Diagnosis not present

## 2014-05-10 DIAGNOSIS — R1010 Upper abdominal pain, unspecified: Secondary | ICD-10-CM | POA: Insufficient documentation

## 2014-05-10 DIAGNOSIS — I252 Old myocardial infarction: Secondary | ICD-10-CM | POA: Insufficient documentation

## 2014-05-10 DIAGNOSIS — Z951 Presence of aortocoronary bypass graft: Secondary | ICD-10-CM | POA: Diagnosis not present

## 2014-05-10 DIAGNOSIS — Z9889 Other specified postprocedural states: Secondary | ICD-10-CM | POA: Insufficient documentation

## 2014-05-10 DIAGNOSIS — Z7982 Long term (current) use of aspirin: Secondary | ICD-10-CM | POA: Insufficient documentation

## 2014-05-10 DIAGNOSIS — I2510 Atherosclerotic heart disease of native coronary artery without angina pectoris: Secondary | ICD-10-CM | POA: Insufficient documentation

## 2014-05-10 DIAGNOSIS — M1990 Unspecified osteoarthritis, unspecified site: Secondary | ICD-10-CM | POA: Diagnosis not present

## 2014-05-10 DIAGNOSIS — Z955 Presence of coronary angioplasty implant and graft: Secondary | ICD-10-CM | POA: Diagnosis not present

## 2014-05-10 DIAGNOSIS — J441 Chronic obstructive pulmonary disease with (acute) exacerbation: Secondary | ICD-10-CM | POA: Diagnosis not present

## 2014-05-10 DIAGNOSIS — Z79899 Other long term (current) drug therapy: Secondary | ICD-10-CM | POA: Insufficient documentation

## 2014-05-10 DIAGNOSIS — Z720 Tobacco use: Secondary | ICD-10-CM | POA: Diagnosis not present

## 2014-05-10 DIAGNOSIS — G2581 Restless legs syndrome: Secondary | ICD-10-CM | POA: Diagnosis not present

## 2014-05-10 DIAGNOSIS — Z7952 Long term (current) use of systemic steroids: Secondary | ICD-10-CM | POA: Diagnosis not present

## 2014-05-10 DIAGNOSIS — K219 Gastro-esophageal reflux disease without esophagitis: Secondary | ICD-10-CM | POA: Insufficient documentation

## 2014-05-10 DIAGNOSIS — M5136 Other intervertebral disc degeneration, lumbar region: Secondary | ICD-10-CM

## 2014-05-10 DIAGNOSIS — M545 Low back pain: Secondary | ICD-10-CM | POA: Diagnosis present

## 2014-05-10 DIAGNOSIS — I129 Hypertensive chronic kidney disease with stage 1 through stage 4 chronic kidney disease, or unspecified chronic kidney disease: Secondary | ICD-10-CM | POA: Insufficient documentation

## 2014-05-10 DIAGNOSIS — M549 Dorsalgia, unspecified: Secondary | ICD-10-CM

## 2014-05-10 DIAGNOSIS — E785 Hyperlipidemia, unspecified: Secondary | ICD-10-CM | POA: Insufficient documentation

## 2014-05-10 DIAGNOSIS — R0600 Dyspnea, unspecified: Secondary | ICD-10-CM

## 2014-05-10 LAB — URINE MICROSCOPIC-ADD ON

## 2014-05-10 LAB — CBC WITH DIFFERENTIAL/PLATELET
Basophils Absolute: 0 10*3/uL (ref 0.0–0.1)
Basophils Relative: 0 % (ref 0–1)
EOS ABS: 0.1 10*3/uL (ref 0.0–0.7)
Eosinophils Relative: 1 % (ref 0–5)
HCT: 44.9 % (ref 36.0–46.0)
HEMOGLOBIN: 15 g/dL (ref 12.0–15.0)
LYMPHS ABS: 1.9 10*3/uL (ref 0.7–4.0)
Lymphocytes Relative: 19 % (ref 12–46)
MCH: 29.5 pg (ref 26.0–34.0)
MCHC: 33.4 g/dL (ref 30.0–36.0)
MCV: 88.4 fL (ref 78.0–100.0)
Monocytes Absolute: 0.7 10*3/uL (ref 0.1–1.0)
Monocytes Relative: 8 % (ref 3–12)
Neutro Abs: 7.1 10*3/uL (ref 1.7–7.7)
Neutrophils Relative %: 72 % (ref 43–77)
PLATELETS: 172 10*3/uL (ref 150–400)
RBC: 5.08 MIL/uL (ref 3.87–5.11)
RDW: 13.3 % (ref 11.5–15.5)
WBC: 9.8 10*3/uL (ref 4.0–10.5)

## 2014-05-10 LAB — URINALYSIS, ROUTINE W REFLEX MICROSCOPIC
Bilirubin Urine: NEGATIVE
Glucose, UA: NEGATIVE mg/dL
Ketones, ur: NEGATIVE mg/dL
Leukocytes, UA: NEGATIVE
NITRITE: NEGATIVE
PROTEIN: NEGATIVE mg/dL
SPECIFIC GRAVITY, URINE: 1.013 (ref 1.005–1.030)
UROBILINOGEN UA: 1 mg/dL (ref 0.0–1.0)
pH: 7 (ref 5.0–8.0)

## 2014-05-10 LAB — RAPID URINE DRUG SCREEN, HOSP PERFORMED
Amphetamines: NOT DETECTED
BARBITURATES: NOT DETECTED
Benzodiazepines: NOT DETECTED
Cocaine: NOT DETECTED
OPIATES: NOT DETECTED
Tetrahydrocannabinol: NOT DETECTED

## 2014-05-10 LAB — COMPREHENSIVE METABOLIC PANEL
ALT: 13 U/L (ref 0–35)
ANION GAP: 12 (ref 5–15)
AST: 17 U/L (ref 0–37)
Albumin: 3.7 g/dL (ref 3.5–5.2)
Alkaline Phosphatase: 132 U/L — ABNORMAL HIGH (ref 39–117)
BUN: 7 mg/dL (ref 6–23)
CALCIUM: 9.3 mg/dL (ref 8.4–10.5)
CO2: 24 meq/L (ref 19–32)
Chloride: 103 mEq/L (ref 96–112)
Creatinine, Ser: 0.74 mg/dL (ref 0.50–1.10)
GFR, EST NON AFRICAN AMERICAN: 85 mL/min — AB (ref >90)
GLUCOSE: 116 mg/dL — AB (ref 70–99)
Potassium: 3.9 mEq/L (ref 3.7–5.3)
Sodium: 139 mEq/L (ref 137–147)
TOTAL PROTEIN: 7 g/dL (ref 6.0–8.3)
Total Bilirubin: 0.6 mg/dL (ref 0.3–1.2)

## 2014-05-10 LAB — ETHANOL: Alcohol, Ethyl (B): <11 mg/dL (ref 0–11)

## 2014-05-10 LAB — LIPASE, BLOOD: Lipase: 58 U/L (ref 11–59)

## 2014-05-10 MED ORDER — HYDROMORPHONE HCL 2 MG/ML IJ SOLN
2.0000 mg | Freq: Once | INTRAMUSCULAR | Status: AC
  Administered 2014-05-10: 2 mg via INTRAMUSCULAR
  Filled 2014-05-10: qty 1

## 2014-05-10 MED ORDER — ONDANSETRON 4 MG PO TBDP
4.0000 mg | ORAL_TABLET | Freq: Once | ORAL | Status: AC
  Administered 2014-05-10: 4 mg via ORAL
  Filled 2014-05-10: qty 1

## 2014-05-10 MED ORDER — LORAZEPAM 1 MG PO TABS: 1.0000 mg | ORAL_TABLET | Freq: Three times a day (TID) | ORAL | Status: DC | PRN

## 2014-05-10 MED ORDER — LORAZEPAM 1 MG PO TABS
2.0000 mg | ORAL_TABLET | Freq: Once | ORAL | Status: AC
  Administered 2014-05-10: 2 mg via ORAL
  Filled 2014-05-10: qty 2

## 2014-05-10 NOTE — Discharge Instructions (Signed)
See your primary care doctor, for checkup in one week. Asked about a referral to a GI doctor; to help with your difficulty eating, and nausea. Use heat on the sore area of your back 3 times a day.    Back Pain, Adult Low back pain is very common. About 1 in 5 people have back pain.The cause of low back pain is rarely dangerous. The pain often gets better over time.About half of people with a sudden onset of back pain feel better in just 2 weeks. About 8 in 10 people feel better by 6 weeks.  CAUSES Some common causes of back pain include:  Strain of the muscles or ligaments supporting the spine.  Wear and tear (degeneration) of the spinal discs.  Arthritis.  Direct injury to the back. DIAGNOSIS Most of the time, the direct cause of low back pain is not known.However, back pain can be treated effectively even when the exact cause of the pain is unknown.Answering your caregiver's questions about your overall health and symptoms is one of the most accurate ways to make sure the cause of your pain is not dangerous. If your caregiver needs more information, he or she may order lab work or imaging tests (X-rays or MRIs).However, even if imaging tests show changes in your back, this usually does not require surgery. HOME CARE INSTRUCTIONS For many people, back pain returns.Since low back pain is rarely dangerous, it is often a condition that people can learn to [facility] their own.   Remain active. It is stressful on the back to sit or stand in one place. Do not sit, drive, or stand in one place for more than 30 minutes at a time. Take short walks on level surfaces as soon as pain allows.Try to increase the length of time you walk each day.  Do not stay in bed.Resting more than 1 or 2 days can delay your recovery.  Do not avoid exercise or work.Your body is made to move.It is not dangerous to be active, even though your back may hurt.Your back will likely heal faster if you return to  being active before your pain is gone.  Pay attention to your body when you bend and lift. Many people have less discomfortwhen lifting if they bend their knees, keep the load close to their bodies,and avoid twisting. Often, the most comfortable positions are those that put less stress on your recovering back.  Find a comfortable position to sleep. Use a firm mattress and lie on your side with your knees slightly bent. If you lie on your back, put a pillow under your knees.  Only take over-the-counter or prescription medicines as directed by your caregiver. Over-the-counter medicines to reduce pain and inflammation are often the most helpful.Your caregiver may prescribe muscle relaxant drugs.These medicines help dull your pain so you can more quickly return to your normal activities and healthy exercise.  Put ice on the injured area.  Put ice in a plastic bag.  Place a towel between your skin and the bag.  Leave the ice on for 15-20 minutes, 03-04 times a day for the first 2 to 3 days. After that, ice and heat may be alternated to reduce pain and spasms.  Ask your caregiver about trying back exercises and gentle massage. This may be of some benefit.  Avoid feeling anxious or stressed.Stress increases muscle tension and can worsen back pain.It is important to recognize when you are anxious or stressed and learn ways to manage it.Exercise is a  great option. SEEK MEDICAL CARE IF:  You have pain that is not relieved with rest or medicine.  You have pain that does not improve in 1 week.  You have new symptoms.  You are generally not feeling well. SEEK IMMEDIATE MEDICAL CARE IF:   You have pain that radiates from your back into your legs.  You develop new bowel or bladder control problems.  You have unusual weakness or numbness in your arms or legs.  You develop nausea or vomiting.  You develop abdominal pain.  You feel faint. Document Released: 07/02/2005 Document Revised:  01/01/2012 Document Reviewed: 11/03/2013 [facility] Patient Information 2015 [place], [place]. This information is not intended to replace advice given to you by your health care provider. Make sure you discuss any questions you have with your health care provider.  Chest Pain (Nonspecific) It is often hard to give a specific diagnosis for the cause of chest pain. There is always a chance that your pain could be related to something serious, such as a heart attack or a blood clot in the lungs. You need to follow up with your health care provider for further evaluation. CAUSES   Heartburn.  Pneumonia or bronchitis.  Anxiety or stress.  Inflammation around your heart (pericarditis) or lung (pleuritis or pleurisy).  A blood clot in the lung.  A collapsed lung (pneumothorax). It can develop suddenly on its own (spontaneous pneumothorax) or from trauma to the chest.  Shingles infection (herpes zoster virus). The chest wall is composed of bones, muscles, and cartilage. Any of these can be the source of the pain.  The bones can be bruised by injury.  The muscles or cartilage can be strained by coughing or overwork.  The cartilage can be affected by inflammation and become sore (costochondritis). DIAGNOSIS  Lab tests or other studies may be needed to find the cause of your pain. Your health care provider may have you take a test called an ambulatory electrocardiogram (ECG). An ECG records your heartbeat patterns over a 24-hour period. You may also have other tests, such as:  Transthoracic echocardiogram (TTE). During echocardiography, sound waves are used to evaluate how blood flows through your heart.  Transesophageal echocardiogram (TEE).  Cardiac monitoring. This allows your health care provider to monitor your heart rate and rhythm in real time.  Holter monitor. This is a portable device that records your heartbeat and can help diagnose heart arrhythmias. It allows your health care  provider to track your heart activity for several days, if needed.  Stress tests by exercise or by giving medicine that makes the heart beat faster. TREATMENT   Treatment depends on what may be causing your chest pain. Treatment may include:  Acid blockers for heartburn.  Anti-inflammatory medicine.  Pain medicine for inflammatory conditions.  Antibiotics if an infection is present.  You may be advised to change lifestyle habits. This includes stopping smoking and avoiding alcohol, caffeine, and chocolate.  You may be advised to keep your head raised (elevated) when sleeping. This reduces the chance of acid going backward from your stomach into your esophagus. Most of the time, nonspecific chest pain will improve within 2-3 days with rest and mild pain medicine.  HOME CARE INSTRUCTIONS   If antibiotics were prescribed, take them as directed. Finish them even if you start to feel better.  For the next few days, avoid physical activities that bring on chest pain. Continue physical activities as directed.  Do not use any tobacco products, including cigarettes, chewing  tobacco, or electronic cigarettes.  Avoid drinking alcohol.  Only take medicine as directed by your health care provider.  Follow your health care provider's suggestions for further testing if your chest pain does not go away.  Keep any follow-up appointments you made. If you do not go to an appointment, you could develop lasting (chronic) problems with pain. If there is any problem keeping an appointment, call to reschedule. SEEK MEDICAL CARE IF:   Your chest pain does not go away, even after treatment.  You have a rash with blisters on your chest.  You have a fever. SEEK IMMEDIATE MEDICAL CARE IF:   You have increased chest pain or pain that spreads to your arm, neck, jaw, back, or abdomen.  You have shortness of breath.  You have an increasing cough, or you cough up blood.  You have severe back or  abdominal pain.  You feel nauseous or vomit.  You have severe weakness.  You faint.  You have chills. This is an emergency. Do not wait to see if the pain will go away. Get medical help at once. Call your local emergency services (911 in [place] Do not drive yourself to the hospital. MAKE SURE YOU:   Understand these instructions.  Will watch your condition.  Will get help right away if you are not doing well or get worse. Document Released: 04/11/2005 Document Revised: 07/07/2013 Document Reviewed: 02/05/2008 [facility] Patient Information 2015 [place], [place]. This information is not intended to replace advice given to you by your health care provider. Make sure you discuss any questions you have with your health care provider.  Degenerative Disk Disease Degenerative disk disease is a condition caused by the changes that occur in the cushions of the backbone (spinal disks) as you grow older. Spinal disks are soft and compressible disks located between the bones of the spine (vertebrae). They act like shock absorbers. Degenerative disk disease can affect the whole spine. However, the neck and lower back are most commonly affected. Many changes can occur in the spinal disks with aging, such as:  The spinal disks may dry and shrink.  Small tears may occur in the tough, outer covering of the disk (annulus).  The disk space may become smaller due to loss of water.  Abnormal growths in the bone (spurs) may occur. This can put pressure on the nerve roots exiting the spinal canal, causing pain.  The spinal canal may become narrowed. CAUSES  Degenerative disk disease is a condition caused by the changes that occur in the spinal disks with aging. The exact cause is not known, but there is a genetic basis for many patients. Degenerative changes can occur due to loss of fluid in the disk. This makes the disk thinner and reduces the space between the backbones. Small cracks can develop in the  outer layer of the disk. This can lead to the breakdown of the disk. You are more likely to get degenerative disk disease if you are overweight. Smoking cigarettes and doing heavy work such as weightlifting can also increase your risk of this condition. Degenerative changes can start after a sudden injury. Growth of bone spurs can compress the nerve roots and cause pain.  SYMPTOMS  The symptoms vary from person to person. Some people may have no pain, while others have severe pain. The pain may be so severe that it can limit your activities. The location of the pain depends on the part of your backbone that is affected. You will have  neck or arm pain if a disk in the neck area is affected. You will have pain in your back, buttocks, or legs if a disk in the lower back is affected. The pain becomes worse while bending, reaching up, or with twisting movements. The pain may start gradually and then get worse as time passes. It may also start after a major or minor injury. You may feel numbness or tingling in the arms or legs.  DIAGNOSIS  Your caregiver will ask you about your symptoms and about activities or habits that may cause the pain. He or she may also ask about any injuries, diseases, or treatments you have had earlier. Your caregiver will examine you to check for the range of movement that is possible in the affected area, to check for strength in your extremities, and to check for sensation in the areas of the arms and legs supplied by different nerve roots. An X-ray of the spine may be taken. Your caregiver may suggest other imaging tests, such as magnetic resonance imaging (MRI), if needed.  TREATMENT  Treatment includes rest, modifying your activities, and applying ice and heat. Your caregiver may prescribe medicines to reduce your pain and may ask you to do some exercises to strengthen your back. In some cases, you may need surgery. You and your caregiver will decide on the treatment that is best for  you. HOME CARE INSTRUCTIONS   Follow proper lifting and walking techniques as advised by your caregiver.  Maintain good posture.  Exercise regularly as advised.  Perform relaxation exercises.  Change your sitting, standing, and sleeping habits as advised. Change positions frequently.  Lose weight as advised.  Stop smoking if you smoke.  Wear supportive footwear. SEEK MEDICAL CARE IF:  Your pain does not go away within 1 to 4 weeks. SEEK IMMEDIATE MEDICAL CARE IF:   Your pain is severe.  You notice weakness in your arms, hands, or legs.  You begin to lose control of your bladder or bowel movements. MAKE SURE YOU:   Understand these instructions.  Will watch your condition.  Will get help right away if you are not doing well or get worse. Document Released: 04/29/2007 Document Revised: 09/24/2011 Document Reviewed: 11/03/2013 [facility] Patient Information 2015 [place], [place]. This information is not intended to replace advice given to you by your health care provider. Make sure you discuss any questions you have with your health care provider.

## 2014-05-10 NOTE — ED Notes (Signed)
Pt c/o low back pain and gen abd pain w/ vomiting x 1 year. States no one can figure out what's wrong with her.  Constant nausea.  Has not thrown up in a week or so.  Family member works on [id number] and states that pt needs to be admitted to figure out what is wrong with her.

## 2014-05-10 NOTE — ED Provider Notes (Signed)
CSN: [id number]     Arrival date & time 05/10/14  1043 History   First MD Initiated Contact with Patient 05/10/14 1112     Chief Complaint  Patient presents with  . Abdominal Pain  . Back Pain     (Consider location/radiation/quality/duration/timing/severity/associated sxs/prior Treatment) HPI  [name] is a 68 y.o. female presents for evaluation of ongoing low back and upper abdominal pain, for one year. She was evaluated in the emergency department, 5 days ago, at that time treated with analgesia, and antiemetics. Family states it was suggested that she see her primary care doctor to be scheduled for an MRI of her lumbar spine, however, "she was too sick to do that today". She is taking her usual medications, without relief. She has both oxycodone and hydrocodone, for pain. She had an upper endoscopy done several months ago without apparent findings, per report of family member. She has had a decreased appetite for many weeks. She has persistent nausea and vomiting for the last 2 days. She had a large bowel movement this morning that was preceded by decreased stooling for several days. There's been no fever, cough, shortness of breath, weakness or dizziness. She is scheduled to have a lithotripsy, for a left sided kidney stone. There are no other known modifying factors.   Past Medical History  Diagnosis Date  . Hyperlipidemia   . GERD (gastroesophageal reflux disease)   . H/O hiatal hernia   . History of MI (myocardial infarction)     1995 S/P  BMS to RCA  . Right ureteral stone   . History of kidney stones   . Coronary atherosclerosis of native coronary vessel cardiologist-  dr Clifton James    hx  MI 1995--  BMS X1 to RCA   . S/p bare metal coronary artery stent     1995 to  RCA  . COPD with emphysema   . Dyspnea on exertion   . Smokers' cough   . Productive cough   . RLS (restless legs syndrome)   . Arthritis   . Wears dentures     UPPER  . Hematuria   . Chronic kidney  disease   . Renal stone 04/2014   Past Surgical History  Procedure Laterality Date  . Extracorporeal shock wave lithotripsy Right 02-18-2014  . Cardiovascular stress test  10-29-2011  [facility]    abnormal nuclear study with fixed moderate basal inferolateral wall defect suggestive of prior MI / mild hypokinesis/  no ischemia/  ef 63%  . Coronary angioplasty with stent placement  1995       BMS X1  to mRCA  . Cardiac catheterization  03-11-2006  dr Eden Emms    LAD  20-30%/  mCFX  20%/   RCA  with widely patent stent,  PDA  20-30%/  EF 60%  . Thyroidectomy, partial  age 22 (approx)    for goiter , benign  . Total abdominal hysterectomy w/ bilateral salpingoophorectomy  age 15's  . Cholecystectomy  yrs ago  . Cystoscopy with ureteroscopy and stent placement Right 03/08/2014    Procedure: RIGHT URETEROSCOPY AND STENT PLACEMENT WITH DILATION OF INFANDIBULAR STENOSIS;  Surgeon: Garnett Farm, MD;  Location: [facility];  Service: Urology;  Laterality: Right;  . Holmium laser application Right 03/08/2014    Procedure: HOLMIUM LASER LITHO;  Surgeon: Garnett Farm, MD;  Location[facility];  Service: Urology;  Laterality: Right;   Family History  Problem Relation Age of  Onset  . Heart attack Father   . Heart attack Mother    History  Substance Use Topics  . Smoking status: Current Every Day Smoker -- 0.50 packs/day for 56 years    Types: Cigarettes  . Smokeless tobacco: Never Used  . Alcohol Use: No   OB History   Grav Para Term Preterm Abortions TAB SAB Ect Mult Living                 Review of Systems  All other systems reviewed and are negative.     Allergies  Contrast media  Home Medications   Prior to Admission medications   Medication Sig Start Date End Date Taking? Authorizing Provider  albuterol (PROVENTIL HFA;VENTOLIN HFA) 108 (90 BASE) MCG/ACT inhaler Inhale 2 puffs into the lungs daily.    Yes Historical Provider, MD  aspirin 325  MG EC tablet Take 325 mg by mouth daily.   Yes Historical Provider, MD  atorvastatin (LIPITOR) 80 MG tablet Take 80 mg by mouth every evening.    Yes Historical Provider, MD  DALIRESP 500 MCG TABS tablet Take 500 mcg by mouth every other day.  11/19/13  Yes Historical Provider, MD  dexamethasone (DECADRON) 4 MG tablet Take 1 tablet (4 mg total) by mouth 2 (two) times daily. 05/06/14  Yes Raeford RazorStephen Kohut, MD  diazepam (VALIUM) 5 MG tablet Take 2.5 mg by mouth every 6 (six) hours as needed for muscle spasms (muscle spasms).   Yes Historical Provider, MD  famotidine (PEPCID) 20 MG tablet Take 20 mg by mouth 2 (two) times daily.    Yes Historical Provider, MD  gabapentin (NEURONTIN) 400 MG capsule Take 400 mg by mouth every evening.    Yes Historical Provider, MD  HYDROcodone-acetaminophen (NORCO/VICODIN) 5-325 MG per tablet Take 1 tablet by mouth every 6 (six) hours as needed for severe pain (severe pain).   Yes Historical Provider, MD  isosorbide mononitrate (IMDUR) 120 MG 24 hr tablet Take 120 mg by mouth every morning.   Yes Historical Provider, MD  ondansetron (ZOFRAN) 4 MG tablet Take 4 mg by mouth every 8 (eight) hours as needed for nausea or vomiting (nausea).    Yes Historical Provider, MD  oxycodone (OXY-IR) 5 MG capsule Take 5 mg by mouth every 4 (four) hours as needed for pain (pain).    Yes Historical Provider, MD  promethazine (PHENERGAN) 12.5 MG tablet Take 12.5 mg by mouth every 6 (six) hours as needed for nausea or vomiting (nausea).    Yes Historical Provider, MD  ibuprofen (ADVIL,MOTRIN) 600 MG tablet Take 1 tablet (600 mg total) by mouth every 6 (six) hours as needed. 05/06/14   Raeford RazorStephen Kohut, MD  LORazepam (ATIVAN) 1 MG tablet Take 1 tablet (1 mg total) by mouth every 8 (eight) hours as needed (muscle spasm). 05/10/14   Flint MelterElliott L Demisha Nokes, MD  nitroGLYCERIN (NITROSTAT) 0.4 MG SL tablet Place 1 tablet (0.4 mg total) under the tongue every 5 (five) minutes as needed for chest pain. 12/01/13    Duke SalviaSteven C Klein, MD   BP 123/68  Pulse 79  Temp(Src) 98.6 F (37 C) (Oral)  Resp 18  SpO2 93% Physical Exam  Nursing note and vitals reviewed. Constitutional: She is oriented to person, place, and time. She appears well-developed. She appears distressed (She is uncomfortable).  Elderly-appearing, frail  HENT:  Head: Normocephalic and atraumatic.  Eyes: Conjunctivae and EOM are normal. Pupils are equal, round, and reactive to light.  Neck: Normal range of motion  and phonation normal. Neck supple.  Cardiovascular: Normal rate and regular rhythm.   Pulmonary/Chest: Effort normal and breath sounds normal. No respiratory distress. She exhibits no tenderness.  Abdominal: Soft. She exhibits no distension and no mass. There is tenderness (Diffuse, moderate). There is no rebound and no guarding.  Musculoskeletal: Normal range of motion.  Mild right lower lumbar tenderness, no tenderness of the lumbar or thoracic spine.  Neurological: She is alert and oriented to person, place, and time. She exhibits normal muscle tone.  Skin: Skin is warm and dry.  Psychiatric: She has a normal mood and affect. Her behavior is normal. Judgment and thought content normal.    ED Course  Procedures (including critical care time) Medications  LORazepam (ATIVAN) tablet 2 mg (2 mg Oral Given 05/10/14 1253)  HYDROmorphone (DILAUDID) injection 2 mg (2 mg Intramuscular Given 05/10/14 1319)  ondansetron (ZOFRAN-ODT) disintegrating tablet 4 mg (4 mg Oral Given 05/10/14 1319)    No data found.   At D/C Reevaluation with update and discussion. After initial assessment and treatment, an updated evaluation reveals She is more comfortable. Findings discussed with pt and family member. Dorann Davidson L    Labs Review Labs Reviewed  COMPREHENSIVE METABOLIC PANEL - Abnormal; Notable for the following:    Glucose, Bld 116 (*)    Alkaline Phosphatase 132 (*)    GFR calc non Af Amer 85 (*)    All other components within  normal limits  URINALYSIS, ROUTINE W REFLEX MICROSCOPIC - Abnormal; Notable for the following:    APPearance CLOUDY (*)    Hgb urine dipstick TRACE (*)    All other components within normal limits  URINE CULTURE  CBC WITH DIFFERENTIAL  LIPASE, BLOOD  URINE RAPID DRUG SCREEN (HOSP PERFORMED)  ETHANOL  URINE MICROSCOPIC-ADD ON    Imaging Review Mr Lumbar Spine Wo Contrast  05/10/2014   CLINICAL DATA:  Low back pain radiating into both legs for several months. RIGHT-greater-than-LEFT sided back pain. Initial encounter.  EXAM: MRI LUMBAR SPINE WITHOUT CONTRAST  TECHNIQUE: Multiplanar, multisequence MR imaging of the lumbar spine was performed. No intravenous contrast was administered.  COMPARISON:  05/06/2014.  FINDINGS: Segmentation: The numbering convention used for this exam termed L5-S1 as the last intervertebral disc space.  Alignment: Mild dextroconvex curve with the apex at L4 which may be positional.  Vertebrae: Normal marrow signal. No destructive osseous lesions. Vertebral body height preserved.  Conus medullaris: Normal termination at L1.  Paraspinal tissues: Chronic common bile duct measuring 9 mm. This is within normal limits for age and commonly seen after cholecystectomy. Tiny LEFT renal cysts.  Disc levels:  Mild disc desiccation for age.  L1-L2:  Negative.  L2-L3:  Shallow circumferential disc bulging.  No stenosis.  L3-L4: Disc desiccation. Mild posterior ligamentum flavum redundancy. Mild LEFT foraminal stenosis associated with eccentric disc bulging. Central canal patent.  L4-L5: Central canal and lateral recesses patent. Foramina patent. Disc desiccation.  L5-S1: Disc desiccation. Shallow disc bulging without resulting stenosis.  IMPRESSION: Mild lumbar spondylosis, detailed above.   Electronically Signed   By: Andreas Newport M.D.   On: 05/10/2014 14:05     EKG Interpretation None      MDM   Final diagnoses:  Back pain  Dyspnea  DDD (degenerative disc disease), lumbar   Nonspecific chest pain    Lumbar DDD without significant radiculopathy or myelopathy. Pain controlled.Nonspecific ongoing abdominal pain with known urolithiasis and planned lithotripsy.  Nursing Notes Reviewed/ Care Coordinated Applicable Imaging Reviewed Interpretation of  Laboratory Data incorporated into ED treatment  The patient appears reasonably screened and/or stabilized for discharge and I doubt any other medical condition or other [facility] requiring further screening, evaluation, or treatment in the ED at this time prior to discharge.  Plan: Home Medications- add Lorazepam for SMR; Home Treatments- rest, heat; return here if the recommended treatment, does not improve the symptoms; Recommended follow up- PCP 1 week    Flint MelterElliott L Jonothan Heberle, MD 05/12/14 1050

## 2014-05-10 NOTE — ED Notes (Signed)
Patient transported to MRI 

## 2014-05-10 NOTE — ED Notes (Signed)
MRI called, will let RN know when to give oral medication to help pt. Tolerate MRI.

## 2014-05-10 NOTE — ED Notes (Signed)
Pharmacist at bedside.

## 2014-05-10 NOTE — ED Notes (Signed)
MD at bedside. 

## 2014-05-10 NOTE — ED Notes (Signed)
Pt returned from MRI °

## 2014-05-10 NOTE — ED Notes (Signed)
Bed: WA20 Expected date:  Expected time:  Means of arrival:  Comments: 

## 2014-05-11 LAB — URINE CULTURE: Colony Count: 7000

## 2014-05-12 NOTE — ED Provider Notes (Signed)
CSN: [id number]     Arrival date & time 05/06/14  1050 History   First MD Initiated Contact with Patient 05/06/14 1133     Chief Complaint  Patient presents with  . Back Pain  . Nausea     (Consider location/radiation/quality/duration/timing/severity/associated sxs/prior Treatment) HPI  68 year old female with lower back pain. Chronic in nature but worse at the last couple weeks. Denies any acute trauma. Pain occasionally radiates down her posterior aspect of right leg. Pain is worse with movement. Improved, but not completely resolved while at rest. No fevers or chills. No urinary complaints. No acute numbness, tingling or loss of strength. Takes Vicodin for pain but has not been getting much relief.  Past Medical History  Diagnosis Date  . Hyperlipidemia   . GERD (gastroesophageal reflux disease)   . H/O hiatal hernia   . History of MI (myocardial infarction)     1995 S/P  BMS to RCA  . Right ureteral stone   . History of kidney stones   . Coronary atherosclerosis of native coronary vessel cardiologist-  dr Clifton Jamesmcalhany    hx  MI 1995--  BMS X1 to RCA   . S/p bare metal coronary artery stent     1995 to  RCA  . COPD with emphysema   . Dyspnea on exertion   . Smokers' cough   . Productive cough   . RLS (restless legs syndrome)   . Arthritis   . Wears dentures     UPPER  . Hematuria   . Chronic kidney disease   . Renal stone 04/2014   Past Surgical History  Procedure Laterality Date  . Extracorporeal shock wave lithotripsy Right 02-18-2014  . [facility]    abnormal nuclear study with fixed moderate basal inferolateral wall defect suggestive of prior MI / mild hypokinesis/  no ischemia/  ef 63%  . Coronary angioplasty with stent placement  1995       BMS X1  to mRCA  . Cardiac catheterization  03-11-2006  dr Eden Emmsnishan    LAD  20-30%/  mCFX  20%/   RCA  with widely patent stent,  PDA  20-30%/  EF 60%  . Thyroidectomy, partial  age 68  (approx)    for goiter , benign  . Total abdominal hysterectomy w/ bilateral salpingoophorectomy  age 68's  . Cholecystectomy  yrs ago  . Cystoscopy with ureteroscopy and stent placement Right 03/08/2014    Procedure: RIGHT URETEROSCOPY AND STENT PLACEMENT WITH DILATION OF INFANDIBULAR STENOSIS;  Surgeon: Garnett FarmMark C Ottelin, MD;  [facility] [place];  Service: Urology;  Laterality: Right;  . Holmium laser application Right 03/08/2014    Procedure: HOLMIUM LASER LITHO;  Surgeon: Garnett FarmMark C Ottelin, MD;  [facility];  Service: Urology;  Laterality: Right;   Family History  Problem Relation Age of Onset  . Heart attack Father   . Heart attack Mother    History  Substance Use Topics  . Smoking status: Current Every Day Smoker -- 0.50 packs/day for 56 years    Types: Cigarettes  . Smokeless tobacco: Never Used  . Alcohol Use: No   OB History   Grav Para Term Preterm Abortions TAB SAB Ect Mult Living                 Review of Systems  All systems reviewed and negative, other than as noted in HPI.   Allergies  Contrast media  Home  Medications   Prior to Admission medications   Medication Sig Start Date End Date Taking? Authorizing Provider  albuterol (PROVENTIL HFA;VENTOLIN HFA) 108 (90 BASE) MCG/ACT inhaler Inhale 2 puffs into the lungs daily.    Yes Historical Provider, MD  atorvastatin (LIPITOR) 80 MG tablet Take 80 mg by mouth every evening.    Yes Historical Provider, MD  DALIRESP 500 MCG TABS tablet Take 500 mcg by mouth every other day.  11/19/13  Yes Historical Provider, MD  famotidine (PEPCID) 20 MG tablet Take 20 mg by mouth 2 (two) times daily.    Yes Historical Provider, MD  gabapentin (NEURONTIN) 400 MG capsule Take 400 mg by mouth every evening.    Yes Historical Provider, MD  isosorbide mononitrate (IMDUR) 120 MG 24 hr tablet Take 120 mg by mouth every morning.   Yes Historical Provider, MD  nitroGLYCERIN (NITROSTAT) 0.4 MG SL tablet  Place 1 tablet (0.4 mg total) under the tongue every 5 (five) minutes as needed for chest pain. 12/01/13  Yes Duke SalviaSteven C Klein, MD  ondansetron (ZOFRAN) 4 MG tablet Take 4 mg by mouth every 8 (eight) hours as needed for nausea or vomiting (nausea).    Yes Historical Provider, MD  oxycodone (OXY-IR) 5 MG capsule Take 5 mg by mouth every 4 (four) hours as needed for pain (pain).    Yes Historical Provider, MD  promethazine (PHENERGAN) 12.5 MG tablet Take 12.5 mg by mouth every 6 (six) hours as needed for nausea or vomiting (nausea).    Yes Historical Provider, MD  aspirin 325 MG EC tablet Take 325 mg by mouth daily.    Historical Provider, MD  dexamethasone (DECADRON) 4 MG tablet Take 1 tablet (4 mg total) by mouth 2 (two) times daily. 05/06/14   Raeford RazorStephen Apoorva Bugay, MD  diazepam (VALIUM) 5 MG tablet Take 2.5 mg by mouth every 6 (six) hours as needed for muscle spasms (muscle spasms).    Historical Provider, MD  HYDROcodone-acetaminophen (NORCO/VICODIN) 5-325 MG per tablet Take 1 tablet by mouth every 6 (six) hours as needed for severe pain (severe pain).    Historical Provider, MD  ibuprofen (ADVIL,MOTRIN) 600 MG tablet Take 1 tablet (600 mg total) by mouth every 6 (six) hours as needed. 05/06/14   Raeford RazorStephen Romy Ipock, MD  LORazepam (ATIVAN) 1 MG tablet Take 1 tablet (1 mg total) by mouth every 8 (eight) hours as needed (muscle spasm). 05/10/14   Flint MelterElliott L Wentz, MD   BP 144/76  Pulse 75  Temp(Src) 98.4 F (36.9 C) (Oral)  Resp 18  SpO2 93% Physical Exam  Nursing note and vitals reviewed. Constitutional: She is oriented to person, place, and time. She appears well-developed and well-nourished. No distress.  HENT:  Head: Normocephalic and atraumatic.  Eyes: Conjunctivae are normal. Right eye exhibits no discharge. Left eye exhibits no discharge.  Neck: Neck supple.  Cardiovascular: Normal rate, regular rhythm and normal heart sounds.  Exam reveals no gallop and no friction rub.   No murmur  heard. Pulmonary/Chest: Effort normal and breath sounds normal. No respiratory distress.  Abdominal: Soft. She exhibits no distension. There is no tenderness.  Musculoskeletal: She exhibits no edema and no tenderness.  No midline spinal tenderness  Neurological: She is alert and oriented to person, place, and time.  Strength is 5 out of 5 bilateral lower extremities. Sensation is intact to light touch. Palpable DP pulses. Patient is able to actively range her right hip and knee. Positive straight leg test on the right.  Skin:  Skin is warm and dry.  Psychiatric: She has a normal mood and affect. Her behavior is normal. Thought content normal.    ED Course  Procedures (including critical care time) Labs Review Labs Reviewed - No data to display  Imaging Review Mr Lumbar Spine Wo Contrast  05/10/2014   CLINICAL DATA:  Low back pain radiating into both legs for several months. RIGHT-greater-than-LEFT sided back pain. Initial encounter.  EXAM: MRI LUMBAR SPINE WITHOUT CONTRAST  TECHNIQUE: Multiplanar, multisequence MR imaging of the lumbar spine was performed. No intravenous contrast was administered.  COMPARISON:  05/06/2014.  FINDINGS: Segmentation: The numbering convention used for this exam termed L5-S1 as the last intervertebral disc space.  Alignment: Mild dextroconvex curve with the apex at L4 which may be positional.  Vertebrae: Normal marrow signal. No destructive osseous lesions. Vertebral body height preserved.  Conus medullaris: Normal termination at L1.  Paraspinal tissues: Chronic common bile duct measuring 9 mm. This is within normal limits for age and commonly seen after cholecystectomy. Tiny LEFT renal cysts.  Disc levels:  Mild disc desiccation for age.  L1-L2:  Negative.  L2-L3:  Shallow circumferential disc bulging.  No stenosis.  L3-L4: Disc desiccation. Mild posterior ligamentum flavum redundancy. Mild LEFT foraminal stenosis associated with eccentric disc bulging. Central canal  patent.  L4-L5: Central canal and lateral recesses patent. Foramina patent. Disc desiccation.  L5-S1: Disc desiccation. Shallow disc bulging without resulting stenosis.  IMPRESSION: Mild lumbar spondylosis, detailed above.   Electronically Signed   By: Andreas Newport M.D.   On: 05/10/2014 14:05     EKG Interpretation None      MDM   Final diagnoses:  Low back pain with right-sided sciatica, unspecified back pain laterality    68 year old female with lower back pain. No acute trauma. Nonfocal neurological examination. Suspect lumbar radiculopathy. Past history of kidney stones but this does not seem urologic in nature. X-ray with osteoporotic changes but no evidence of compression fracture or other acute pathology. Symptomatic treatment. TURP options discussed.    Raeford Razor, MD 05/12/14 1032

## 2014-05-17 ENCOUNTER — Encounter (HOSPITAL_COMMUNITY): Payer: Self-pay | Admitting: *Deleted

## 2014-05-17 ENCOUNTER — Ambulatory Visit (HOSPITAL_COMMUNITY): Payer: Medicare HMO

## 2014-05-17 ENCOUNTER — Ambulatory Visit (HOSPITAL_COMMUNITY)
Admission: RE | Admit: 2014-05-17 | Discharge: 2014-05-17 | Disposition: A | Discharge status: Home / Self Care | Payer: Medicare HMO | Source: Ambulatory Visit | Attending: Urology | Admitting: Urology

## 2014-05-17 ENCOUNTER — Encounter (HOSPITAL_COMMUNITY)
Admission: RE | Disposition: A | Discharge status: Home / Self Care | Payer: Self-pay | Source: Ambulatory Visit | Attending: Urology

## 2014-05-17 DIAGNOSIS — I2510 Atherosclerotic heart disease of native coronary artery without angina pectoris: Secondary | ICD-10-CM | POA: Diagnosis not present

## 2014-05-17 DIAGNOSIS — J449 Chronic obstructive pulmonary disease, unspecified: Secondary | ICD-10-CM | POA: Diagnosis not present

## 2014-05-17 DIAGNOSIS — R609 Edema, unspecified: Secondary | ICD-10-CM | POA: Diagnosis not present

## 2014-05-17 DIAGNOSIS — E782 Mixed hyperlipidemia: Secondary | ICD-10-CM | POA: Insufficient documentation

## 2014-05-17 DIAGNOSIS — N201 Calculus of ureter: Secondary | ICD-10-CM

## 2014-05-17 DIAGNOSIS — K219 Gastro-esophageal reflux disease without esophagitis: Secondary | ICD-10-CM | POA: Insufficient documentation

## 2014-05-17 HISTORY — DX: Chronic kidney disease, unspecified: N18.9

## 2014-05-17 HISTORY — DX: Calculus of kidney: N20.0

## 2014-05-17 SURGERY — LITHOTRIPSY, ESWL
Anesthesia: LOCAL | Laterality: Left

## 2014-05-17 MED ORDER — HYDROCODONE-ACETAMINOPHEN 5-325 MG PO TABS
1.0000 | ORAL_TABLET | Freq: Once | ORAL | Status: AC
  Administered 2014-05-17: 1 via ORAL
  Filled 2014-05-17: qty 1

## 2014-05-17 MED ORDER — SODIUM CHLORIDE 0.9 % IV SOLN
INTRAVENOUS | Status: DC
  Administered 2014-05-17: 10:00:00 via INTRAVENOUS

## 2014-05-17 MED ORDER — CIPROFLOXACIN HCL 500 MG PO TABS
500.0000 mg | ORAL_TABLET | ORAL | Status: AC
  Administered 2014-05-17: 500 mg via ORAL
  Filled 2014-05-17: qty 1

## 2014-05-17 MED ORDER — ONDANSETRON HCL 4 MG PO TABS: 4.0000 mg | ORAL_TABLET | Freq: Three times a day (TID) | ORAL | Status: DC | PRN

## 2014-05-17 MED ORDER — OXYCODONE-ACETAMINOPHEN 10-325 MG PO TABS: 1.0000 | ORAL_TABLET | ORAL | Status: DC | PRN

## 2014-05-17 MED ORDER — TAMSULOSIN HCL 0.4 MG PO CAPS: 0.4000 mg | ORAL_CAPSULE | ORAL | Status: DC

## 2014-05-17 MED ORDER — DIPHENHYDRAMINE HCL 25 MG PO CAPS
25.0000 mg | ORAL_CAPSULE | ORAL | Status: AC
  Administered 2014-05-17: 25 mg via ORAL
  Filled 2014-05-17: qty 1

## 2014-05-17 MED ORDER — DIAZEPAM 5 MG PO TABS
10.0000 mg | ORAL_TABLET | ORAL | Status: AC
  Administered 2014-05-17: 10 mg via ORAL
  Filled 2014-05-17: qty 2

## 2014-05-17 NOTE — Discharge Instructions (Signed)

## 2014-05-17 NOTE — Progress Notes (Signed)
Patient here for lithotripsy. States pain in back is 8/10 from osteoporosis. Took 1/2 Vicodin last night at dinner time and none this am. Requested to take pain med. Dr. Vernie Ammonsttelin called and order received she can take one vicodin now.

## 2014-05-17 NOTE — H&P (Signed)
[name] is a 68 year old female with a left ureteral calculus.   History of Present Illness    Calculus disease: She had reported having pelvic discomfort and back pain for 9 months. A CT scan done on 01/12/14 revealed small bilateral renal calculi that were nonobstructing as well as a 6 mm stone with Hounsfield units of ~800 located at the L3 level on the right. She reports that she underwent lithotripsy by Dr. Saddie Bendershao for stone located in the left kidney.     Gross hematuria: She indicated having gross hematuria for 9 months primarily in the morning and not associated with any dysuria. [name] CT scan revealed no renal lesions but a 6 mm right ureteral stone was identified. Cystoscopic evaluation was negative.     Cystocele: She reported that [name] "bladder has dropped" but she denies having any difficulty initiating [name] urinary stream or stress incontinence.    Interval history: She was seen recently for gross hematuria and back pain. A CT scan on 04/26/14 revealed bilateral nonobstructing renal calculi and a 6 mm stone located at the left ureterovesical junction.  She reports she has not been having flank pain but has low back pain that radiates down [name] right leg. She said the pain she is experiencing in [name] back is unlike what she has had with kidney stones past.    Past Medical History Problems  1. History of Bladder calculus (N21.0) 2. History of esophageal reflux (Z87.19) 3. History of hiatal hernia (Z87.19) 4. History of hypercholesterolemia (Z86.39) 5. History of myocardial infarction (I25.2)  Surgical History Problems  1. History of Cystoscopy With Insertion Of Ureteral Stent Right 2. History of Cystoscopy With Ureteroscopy With Removal Of Calculus 3. History of Gallbladder Surgery 4. History of Hysteroscopy Of Uterus 5. History of Lithotripsy 6. History of Throat Surgery  Current Meds 1. Atorvastatin Calcium 80 MG Oral Tablet;  Therapy: 13May2015 to Recorded 2.  Daliresp 500 MCG Oral Tablet;  Therapy: 16Jun2015 to Recorded 3. Famotidine 20 MG Oral Tablet;  Therapy: 07Apr2015 to Recorded 4. Gabapentin 400 MG Oral Capsule;  Therapy: 13Jul2015 to Recorded 5. Isosorbide Mononitrate ER 120 MG Oral Tablet Extended Release 24 Hour;  Therapy: 13May2015 to Recorded 6. Naproxen 500 MG Oral Tablet;  Therapy: (Recorded:06Oct2015) to Recorded 7. Nitrostat 0.4 MG Sublingual Tablet Sublingual;  Therapy: 19May2015 to Recorded 8. Ondansetron 4 MG Oral Tablet Dispersible; TAKE 1 TABLET 4 times daily PRN;  Therapy: 12Oct2015 to (Evaluate:26Oct2015)  Requested for: 12Oct2015; Last  Rx:12Oct2015 Ordered 9. Symbicort 160-4.5 MCG/ACT Inhalation Aerosol;  Therapy: (Recorded:31Jul2015) to Recorded 10. Uribel 118 MG Oral Capsule;   Therapy: (Recorded:28Aug2015) to Recorded  Allergies Medication  1. No Known Drug Allergies Non-Medication  2. Contrast Dye  Family History Problems  1. Family history of myocardial infarction (Z82.49) : Mother 2. Family history of stroke (Z82.3) : Father, Mother  Social History Problems  1. Denied: History of Alcohol use 2. Caffeine use (F15.90) 3. Current smoker (F17.200)   1/2 ppd 4. [place] of Systems Genitourinary, constitutional, skin, eye, otolaryngeal, hematologic/lymphatic, cardiovascular, pulmonary, endocrine, musculoskeletal, gastrointestinal, neurological and psychiatric system(s) were reviewed and pertinent findings if present are noted.  Genitourinary: nocturia and hematuria.    Vitals Vital Signs Height: 5 ft 7 in Weight: 132 lb  BMI Calculated: 20.67 BSA Calculated: 1.69 Blood Pressure: 144 / 80 Temperature: 97.3 F Heart Rate: 94  Physical Exam Constitutional: Well nourished and well developed . No acute distress.   ENT:. The ears and nose are normal  in appearance.   Neck: The appearance of the neck is normal and no neck mass is present.   Pulmonary: No respiratory distress and normal  respiratory rhythm and effort.   Cardiovascular: Heart rate and rhythm are normal . No peripheral edema.   Abdomen: The abdomen is soft and nontender. No masses are palpated. No CVA tenderness. No hernias are palpable. No hepatosplenomegaly noted.  Genitourinary:  Chaperone Present: .  Examination of the external genitalia shows normal female external genitalia and no lesions. The urethra is normal in appearance and not tender. There is no urethral mass. Vaginal exam demonstrates no abnormalities. A cystocele is present. The bladder is non tender and not distended. The anus is normal on inspection. The perineum is normal on inspection.   Lymphatics: The femoral and inguinal nodes are not enlarged or tender.   Skin: Normal skin turgor, no visible rash and no visible skin lesions.   Neuro/Psych:. Mood and affect are appropriate.    The following images/tracing/specimen were independently visualized:  KUB: The stone located at the ureterovesical junction left hand side remains unchanged. Bilateral renal calculi are noted and unchanged as well.  The following clinical lab reports were reviewed:  UA: [name] urine was actually clear today. Selected Results    Test Name Result Flag Reference CT-STONE PROTOCOL (Report)   ** RADIOLOGY REPORT BY [facility] **   CLINICAL DATA: Microscopic and gross hematuria beginning 1 week ago. Prior history of renal calculi.  EXAM: CT ABDOMEN AND PELVIS WITHOUT CONTRAST (URINARY CALCULUS PROTOCOL)  TECHNIQUE: Multidetector CT imaging was performed through the abdomen and pelvis without intravenous contrast to include the urinary tract.  COMPARISON: 02/22/2014  FINDINGS: Bilateral nonobstructing renal calculi are reidentified, largest 3 mm right upper pole image 9 and largest 4 mm left lower renal pole image 20. There has been apparent interval passage of a 6 mm previously seen left renal calculus to the level of the left ureterovesicular  junction image 65. Pelvic phleboliths are reidentified elsewhere in the pelvis. No hydroureteronephrosis is identified. No radiopaque right ureteral or bladder calculus otherwise identified.  Unenhanced visualized aspects of the liver, adrenal glands, spleen, and pancreas are normal. Moderate to severe atheromatous aortic and branch vessel calcification present without aneurysm.  Colon is collapsed but otherwise normal in appearance. Normal appendix. No small bowel wall thickening or focal segmental dilatation.  Uterus and ovaries not visualized, presumably surgically absent.  Left symphysis pubis bone island reidentified. Left ischial bone island image 48. Injection granulomas are identified in the box. Mild disc degenerative change within the lumbar spine.  IMPRESSION: 6 mm left ureterovesicular junction calculus without hydroureteronephrosis.   Electronically Signed  By: Christiana PellantGretchen Green M.D.  On: 04/26/2014 11:27  Assessment  We discussed the management of urinary stones. These options include observation, ureteroscopy, shockwave lithotripsy, and PCNL. We discussed which options are relevant to these particular stones. We discussed the natural history of stones as well as the complications of untreated stones and the impact on quality of life without treatment as well as with each of the above listed treatments. We also discussed the efficacy of each treatment in its ability to clear the stone burden. With any of these management options I discussed the signs and symptoms of infection and the need for emergent treatment should these be experienced. For each option we discussed the ability of each procedure to clear the patient of their stone burden.    For observation I described the risks which include but are  not limited to silent renal damage, life-threatening infection, need for emergent surgery, failure to pass stone, and pain.    For ureteroscopy I described the risks  which include heart attack, stroke, pulmonary embolus, death, bleeding, infection, damage to contiguous structures, positioning injury, ureteral stricture, ureteral avulsion, ureteral injury, need for ureteral stent, inability to perform ureteroscopy, need for an interval procedure, inability to clear stone burden, stent discomfort and pain.    For shockwave lithotripsy I described the risks which include arrhythmia, kidney contusion, kidney hemorrhage, need for transfusion, long-term risk of diabetes or hypertension, back discomfort, flank ecchymosis, flank abrasion, inability to break up stone, inability to pass stone fragments, Steinstrasse, infection associated with obstructing stones, need for different surgical procedure, need for repeat shockwave lithotripsy, and death.    She has had both ureteroscopy and lithotripsy in the past. [name] stone, with Hounsfield units of proximally 500, would likely fragment well with lithotripsy and she would like to proceed with lithotripsy rather than ureteroscopy.    I have recommended she contact [name] primary care physician regarding referral for further evaluation of [name] back pain with radiation into [name] right lower extremity. She is primarily bothered by this pain.   Plan  1. She'll be started on medical expulsive therapy.  2. She will be scheduled for lithotripsy of [name] left distal ureteral stone.

## 2014-05-17 NOTE — Op Note (Signed)
See [facility] OP note scanned into chart. 

## 2014-06-07 ENCOUNTER — Encounter: Payer: Self-pay | Admitting: Endocrinology

## 2014-06-07 ENCOUNTER — Ambulatory Visit (INDEPENDENT_AMBULATORY_CARE_PROVIDER_SITE_OTHER): Payer: Commercial Managed Care - HMO | Admitting: Endocrinology

## 2014-06-07 VITALS — BP 144/72 | HR 83 | Temp 98.0°F | Resp 14 | Ht 65.0 in | Wt 126.8 lb

## 2014-06-07 DIAGNOSIS — R634 Abnormal weight loss: Secondary | ICD-10-CM

## 2014-06-07 DIAGNOSIS — R7989 Other specified abnormal findings of blood chemistry: Secondary | ICD-10-CM

## 2014-06-07 NOTE — Progress Notes (Signed)
Patient ID: [name], female   DOB: [birth year], 68 y.o.   MRN: [id number]                                                                                           Reason for Appointment:  ?  Hyperthyroidism, new consultation  Referring physician: [facility]   History of Present Illness:   For the last year the patient has had multiple symptoms. She thinks her chief complaint is weakness.  However she has had other problems including decreased appetite and significant weight loss of about 28 pounds Most of her symptoms started about a year ago She also has symptoms of fatigue, shakiness of her hand at times and occasional palpitations She does not complain of any heat intolerance or sweating She does have periodic diarrhea and abdominal discomfort  The patient was evaluated with thyroid function tests on 05/24/14 which showed the following:   TSH 0.19 Free T4 = 1.14 Total T3 = 167  Previous thyroid levels available are as follows  Lab Results  Component Value Date   TSH 0.49 05/11/2010    She is now referred here for further management         Medication List       This list is accurate as of: 06/07/14  2:35 PM.  Always use your most recent med list.               albuterol 108 (90 BASE) MCG/ACT inhaler  Commonly known as:  PROVENTIL HFA;VENTOLIN HFA  Inhale 2 puffs into the lungs daily.     aspirin 325 MG EC tablet  Take 325 mg by mouth daily.     atorvastatin 80 MG tablet  Commonly known as:  LIPITOR  Take 80 mg by mouth every evening.     budesonide-formoterol 80-4.5 MCG/ACT inhaler  Commonly known as:  SYMBICORT  Inhale 2 puffs into the lungs 2 (two) times daily.     DALIRESP 500 MCG Tabs tablet  Generic drug:  roflumilast  Take 500 mcg by mouth every other day.     diazepam 5 MG tablet  Commonly known as:  VALIUM  Take 2.5 mg by mouth every 6 (six) hours as needed for muscle spasms (muscle spasms).     famotidine 20 MG tablet  Commonly known  as:  PEPCID  Take 20 mg by mouth 2 (two) times daily.     gabapentin 400 MG capsule  Commonly known as:  NEURONTIN  Take 400 mg by mouth every evening.     HYDROcodone-acetaminophen 5-325 MG per tablet  Commonly known as:  NORCO/VICODIN  Take 1 tablet by mouth every 6 (six) hours as needed for severe pain (severe pain).     ibuprofen 600 MG tablet  Commonly known as:  ADVIL,MOTRIN  Take 1 tablet (600 mg total) by mouth every 6 (six) hours as needed.     isosorbide mononitrate 120 MG 24 hr tablet  Commonly known as:  IMDUR  Take 120 mg by mouth every morning.     LORazepam 1 MG tablet  Commonly known as:  ATIVAN  Take 1 tablet (  1 mg total) by mouth every 8 (eight) hours as needed (muscle spasm).     nitroGLYCERIN 0.4 MG SL tablet  Commonly known as:  NITROSTAT  Place 1 tablet (0.4 mg total) under the tongue every 5 (five) minutes as needed for chest pain.     ondansetron 4 MG tablet  Commonly known as:  ZOFRAN  Take 1 tablet (4 mg total) by mouth every 8 (eight) hours as needed for nausea or vomiting.     oxycodone 5 MG capsule  Commonly known as:  OXY-IR  Take 5 mg by mouth every 4 (four) hours as needed for pain (pain).     oxyCODONE-acetaminophen 10-325 MG per tablet  Commonly known as:  PERCOCET  Take 1-2 tablets by mouth every 4 (four) hours as needed for pain. Maximum dose per 24 hours - 8 pills     promethazine 12.5 MG tablet  Commonly known as:  PHENERGAN  Take 12.5 mg by mouth every 6 (six) hours as needed for nausea or vomiting (nausea).     tamsulosin 0.4 MG Caps capsule  Commonly known as:  FLOMAX  Take 1 capsule (0.4 mg total) by mouth daily after supper.            Past Medical History  Diagnosis Date  . Hyperlipidemia   . GERD (gastroesophageal reflux disease)   . H/O hiatal hernia   . History of MI (myocardial infarction)     1995 S/P  BMS to RCA  . Right ureteral stone   . History of kidney stones   . Coronary atherosclerosis of native  coronary vessel cardiologist-  dr Clifton Jamesmcalhany    hx  MI 1995--  BMS X1 to RCA   . S/p bare metal coronary artery stent     1995 to  RCA  . COPD with emphysema   . Dyspnea on exertion   . Smokers' cough   . Productive cough   . RLS (restless legs syndrome)   . Arthritis   . Wears dentures     UPPER  . Hematuria   . Chronic kidney disease   . Renal stone 04/2014    Past Surgical History  Procedure Laterality Date  . Extracorporeal shock wave lithotripsy Right 02-18-2014  . Cardiovascular stress [facility]  [facility]    abnormal nuclear study with fixed moderate basal inferolateral wall defect suggestive of prior MI / mild hypokinesis/  no ischemia/  ef 63%  . Coronary angioplasty with stent placement  1995       BMS X1  to mRCA  . Cardiac catheterization  03-11-2006  dr Eden Emmsnishan    LAD  20-30%/  mCFX  20%/   RCA  with widely patent stent,  PDA  20-30%/  EF 60%  . Thyroidectomy, partial  age 68 (approx)    for goiter , benign  . Total abdominal hysterectomy w/ bilateral salpingoophorectomy  age 68's  . Cholecystectomy  yrs ago  . Cystoscopy with ureteroscopy and stent placement Right 03/08/2014    Procedure: RIGHT URETEROSCOPY AND STENT PLACEMENT WITH DILATION OF INFANDIBULAR STENOSIS;  Surgeon: Garnett FarmMark C Ottelin, MD;  [facility] [place];  Service: Urology;  Laterality: Right;  . Holmium laser application Right 03/08/2014    Procedure: HOLMIUM LASER LITHO;  Surgeon: Garnett FarmMark C Ottelin, MD;  [facility] ;  Service: Urology;  Laterality: Right;    Family History  Problem Relation Age of Onset  . Heart attack Father   .  Heart attack Mother     Social History:  reports that she has been smoking Cigarettes.  She has a 28 pack-year smoking history. She has never used smokeless tobacco. She reports that she does not drink alcohol or use illicit drugs.  Allergies:  Allergies  Allergen Reactions  . Contrast Media [Iodinated Diagnostic Agents]  Shortness Of Breath    Review of Systems:  She has a history of COPD taking a couple of different inhalers Has no  history of high blood pressure.    She does take Lipitor for hyperlipidemia and history of CAD      She apparently has had GI evaluation from her gastroenterologist with no specific findings She tends to have nausea at times and is taking symptomatic treatment  Negative  history of Diabetes.     No swelling of feet       Has history of kidney stone She has had some jerking movements of her legs, some spasms and numbness in her feet and is being treated with gabapentin   Examination:   BP 144/72 mmHg  Pulse 83  Temp(Src) 98 F (36.7 C)  Resp 14  Ht 5\' 5"  (1.651 m)  Wt 126 lb 12.8 oz (57.516 kg)  BMI 21.10 kg/m2  SpO2 94%   General Appearance:  well-built and nourished, pleasant, mildly anxious but not hyperkinetic..        Eyes: No excessive prominence, lid lag or stare. No swelling of the eyelids  Neck: The thyroid is nonpalpable  There is no lymphadenopathy .          Heart: normal S1 and S2, no murmurs .         Lungs: breath sounds are clear bilaterally Neurological: REFLEXES: at biceps are brisk   TREMORS:  mild bilateral slightly coarse tremors are present.   Extremities: hands are normal temperature. No ankle edema.   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

## 2014-06-13 ENCOUNTER — Encounter (HOSPITAL_COMMUNITY): Payer: Self-pay | Admitting: Emergency Medicine

## 2014-06-13 ENCOUNTER — Emergency Department (HOSPITAL_COMMUNITY)
Admission: EM | Admit: 2014-06-13 | Discharge: 2014-06-13 | Disposition: A | Discharge status: Home / Self Care | Payer: Commercial Managed Care - HMO | Attending: Emergency Medicine | Admitting: Emergency Medicine

## 2014-06-13 ENCOUNTER — Emergency Department (HOSPITAL_COMMUNITY): Payer: Commercial Managed Care - HMO

## 2014-06-13 DIAGNOSIS — M1990 Unspecified osteoarthritis, unspecified site: Secondary | ICD-10-CM | POA: Diagnosis not present

## 2014-06-13 DIAGNOSIS — Z8719 Personal history of other diseases of the digestive system: Secondary | ICD-10-CM | POA: Diagnosis not present

## 2014-06-13 DIAGNOSIS — I2510 Atherosclerotic heart disease of native coronary artery without angina pectoris: Secondary | ICD-10-CM | POA: Insufficient documentation

## 2014-06-13 DIAGNOSIS — Z7951 Long term (current) use of inhaled steroids: Secondary | ICD-10-CM | POA: Diagnosis not present

## 2014-06-13 DIAGNOSIS — N189 Chronic kidney disease, unspecified: Secondary | ICD-10-CM | POA: Diagnosis not present

## 2014-06-13 DIAGNOSIS — Z87442 Personal history of urinary calculi: Secondary | ICD-10-CM | POA: Insufficient documentation

## 2014-06-13 DIAGNOSIS — Z79899 Other long term (current) drug therapy: Secondary | ICD-10-CM | POA: Insufficient documentation

## 2014-06-13 DIAGNOSIS — Z720 Tobacco use: Secondary | ICD-10-CM | POA: Insufficient documentation

## 2014-06-13 DIAGNOSIS — J439 Emphysema, unspecified: Secondary | ICD-10-CM | POA: Diagnosis not present

## 2014-06-13 DIAGNOSIS — Z9861 Coronary angioplasty status: Secondary | ICD-10-CM | POA: Diagnosis not present

## 2014-06-13 DIAGNOSIS — Z8639 Personal history of other endocrine, nutritional and metabolic disease: Secondary | ICD-10-CM | POA: Diagnosis not present

## 2014-06-13 DIAGNOSIS — Z9049 Acquired absence of other specified parts of digestive tract: Secondary | ICD-10-CM | POA: Insufficient documentation

## 2014-06-13 DIAGNOSIS — G8929 Other chronic pain: Secondary | ICD-10-CM | POA: Insufficient documentation

## 2014-06-13 DIAGNOSIS — R110 Nausea: Secondary | ICD-10-CM | POA: Insufficient documentation

## 2014-06-13 DIAGNOSIS — R1084 Generalized abdominal pain: Secondary | ICD-10-CM | POA: Diagnosis present

## 2014-06-13 DIAGNOSIS — R42 Dizziness and giddiness: Secondary | ICD-10-CM | POA: Insufficient documentation

## 2014-06-13 DIAGNOSIS — Z9889 Other specified postprocedural states: Secondary | ICD-10-CM | POA: Diagnosis not present

## 2014-06-13 DIAGNOSIS — R109 Unspecified abdominal pain: Secondary | ICD-10-CM

## 2014-06-13 DIAGNOSIS — Z972 Presence of dental prosthetic device (complete) (partial): Secondary | ICD-10-CM | POA: Diagnosis not present

## 2014-06-13 DIAGNOSIS — I252 Old myocardial infarction: Secondary | ICD-10-CM | POA: Insufficient documentation

## 2014-06-13 DIAGNOSIS — R748 Abnormal levels of other serum enzymes: Secondary | ICD-10-CM | POA: Insufficient documentation

## 2014-06-13 DIAGNOSIS — G2581 Restless legs syndrome: Secondary | ICD-10-CM | POA: Insufficient documentation

## 2014-06-13 LAB — COMPREHENSIVE METABOLIC PANEL
ALK PHOS: 132 U/L — AB (ref 39–117)
ALT: 8 U/L (ref 0–35)
AST: 26 U/L (ref 0–37)
Albumin: 3.9 g/dL (ref 3.5–5.2)
Anion gap: 13 (ref 5–15)
BUN: 5 mg/dL — ABNORMAL LOW (ref 6–23)
CO2: 26 meq/L (ref 19–32)
Calcium: 9.8 mg/dL (ref 8.4–10.5)
Chloride: 106 mEq/L (ref 96–112)
Creatinine, Ser: 0.79 mg/dL (ref 0.50–1.10)
GFR, EST NON AFRICAN AMERICAN: 83 mL/min — AB (ref >90)
GLUCOSE: 118 mg/dL — AB (ref 70–99)
POTASSIUM: 4.8 meq/L (ref 3.7–5.3)
Sodium: 145 mEq/L (ref 137–147)
Total Bilirubin: 0.5 mg/dL (ref 0.3–1.2)
Total Protein: 7.2 g/dL (ref 6.0–8.3)

## 2014-06-13 LAB — URINALYSIS, ROUTINE W REFLEX MICROSCOPIC
Bilirubin Urine: NEGATIVE
GLUCOSE, UA: NEGATIVE mg/dL
HGB URINE DIPSTICK: NEGATIVE
KETONES UR: NEGATIVE mg/dL
Leukocytes, UA: NEGATIVE
Nitrite: NEGATIVE
PROTEIN: NEGATIVE mg/dL
Specific Gravity, Urine: 1.006 (ref 1.005–1.030)
Urobilinogen, UA: 0.2 mg/dL (ref 0.0–1.0)
pH: 5.5 (ref 5.0–8.0)

## 2014-06-13 LAB — CBC WITH DIFFERENTIAL/PLATELET
Basophils Absolute: 0 10*3/uL (ref 0.0–0.1)
Basophils Relative: 0 % (ref 0–1)
EOS PCT: 0 % (ref 0–5)
Eosinophils Absolute: 0 10*3/uL (ref 0.0–0.7)
HCT: 46.5 % — ABNORMAL HIGH (ref 36.0–46.0)
HEMOGLOBIN: 15.1 g/dL — AB (ref 12.0–15.0)
LYMPHS PCT: 21 % (ref 12–46)
Lymphs Abs: 2 10*3/uL (ref 0.7–4.0)
MCH: 28.9 pg (ref 26.0–34.0)
MCHC: 32.5 g/dL (ref 30.0–36.0)
MCV: 89.1 fL (ref 78.0–100.0)
Monocytes Absolute: 0.7 10*3/uL (ref 0.1–1.0)
Monocytes Relative: 7 % (ref 3–12)
NEUTROS PCT: 72 % (ref 43–77)
Neutro Abs: 6.9 10*3/uL (ref 1.7–7.7)
PLATELETS: 192 10*3/uL (ref 150–400)
RBC: 5.22 MIL/uL — AB (ref 3.87–5.11)
RDW: 14.1 % (ref 11.5–15.5)
WBC: 9.7 10*3/uL (ref 4.0–10.5)

## 2014-06-13 LAB — LIPASE, BLOOD: Lipase: 156 U/L — ABNORMAL HIGH (ref 11–59)

## 2014-06-13 MED ORDER — IOHEXOL 300 MG/ML  SOLN
50.0000 mL | Freq: Once | INTRAMUSCULAR | Status: AC | PRN
  Administered 2014-06-13: 50 mL via ORAL

## 2014-06-13 NOTE — Discharge Instructions (Signed)
Abdominal Pain °Many things can cause abdominal pain. Usually, abdominal pain is not caused by a disease and will improve without treatment. It can often be observed and treated at home. Your health care provider will do a physical exam and possibly order blood tests and X-rays to help determine the seriousness of your pain. However, in many cases, more time must pass before a clear cause of the pain can be found. Before that point, your health care provider may not know if you need more testing or further treatment. °HOME CARE INSTRUCTIONS  °Monitor your abdominal pain for any changes. The following actions may help to alleviate any discomfort you are experiencing: °· Only take over-the-counter or prescription medicines as directed by your health care provider. °· Do not take laxatives unless directed to do so by your health care provider. °· Try a clear liquid diet (broth, tea, or water) as directed by your health care provider. Slowly move to a bland diet as tolerated. °SEEK MEDICAL CARE IF: °· You have unexplained abdominal pain. °· You have abdominal pain associated with nausea or diarrhea. °· You have pain when you urinate or have a bowel movement. °· You experience abdominal pain that wakes you in the night. °· You have abdominal pain that is worsened or improved by eating food. °· You have abdominal pain that is worsened with eating fatty foods. °· You have a fever. °SEEK IMMEDIATE MEDICAL CARE IF:  °· Your pain does not go away within 2 hours. °· You keep throwing up (vomiting). °· Your pain is felt only in portions of the abdomen, such as the right side or the left lower portion of the abdomen. °· You pass bloody or black tarry stools. °MAKE SURE YOU: °· Understand these instructions.   °· Will watch your condition.   °· Will get help right away if you are not doing well or get worse.   °Document Released: 04/11/2005 Document Revised: 07/07/2013 Document Reviewed: 03/11/2013 °[employer]® Patient Information  ©2015 [employer]. This information is not intended to replace advice given to you by your health care provider. Make sure you discuss any questions you have with your health care provider. ° ° °Flank Pain °Flank pain refers to pain that is located on the side of the body between the upper abdomen and the back. The pain may occur over a short period of time (acute) or may be long-term or reoccurring (chronic). It may be mild or severe. Flank pain can be caused by many things. °CAUSES  °Some of the more common causes of flank pain include: °· Muscle strains.   °· Muscle spasms.   °· A disease of your spine (vertebral disk disease).   °· A lung infection (pneumonia).   °· Fluid around your lungs (pulmonary edema).   °· A kidney infection.   °· Kidney stones.   °· A very painful skin rash caused by the chickenpox virus (shingles).   °· Gallbladder disease.   °HOME CARE INSTRUCTIONS  °Home care will depend on the cause of your pain. In general, °· Rest as directed by your caregiver. °· Drink enough fluids to keep your urine clear or pale yellow. °· Only take over-the-counter or prescription medicines as directed by your caregiver. Some medicines may help relieve the pain. °· Tell your caregiver about any changes in your pain. °· Follow up with your caregiver as directed. °SEEK IMMEDIATE MEDICAL CARE IF:  °· Your pain is not controlled with medicine.   °· You have new or worsening symptoms. °· Your pain increases.   °· You have abdominal   pain.   °· You have shortness of breath.   °· You have persistent nausea or vomiting.   °· You have swelling in your abdomen.   °· You feel faint or pass out.   °· You have blood in your urine. °· You have a fever or persistent symptoms for more than 2-3 days. °· You have a fever and your symptoms suddenly get worse. °MAKE SURE YOU:  °· Understand these instructions. °· Will watch your condition. °· Will get help right away if you are not doing well or get worse. °Document Released:  08/23/2005 Document Revised: 03/26/2012 Document Reviewed: 02/14/2012 °[employer]® Patient Information ©2015 [employer]. This information is not intended to replace advice given to you by your health care provider. Make sure you discuss any questions you have with your health care provider. ° °

## 2014-06-13 NOTE — ED Provider Notes (Signed)
CSN: [id number]     Arrival date & time 06/13/14  1015 History   First MD Initiated Contact with Patient 06/13/14 1107     Chief Complaint  Patient presents with  . Nausea  . Flank Pain    [name] is a 68 y.o. female with a history of GERD, MI, kidney stones, COPD and chronic abdominal pain who presents to the emergency department complaining of increased nausea. Patient reports she's been feeling ill for a year. Patient reports she wakes up every morning feeling nauseated and shaky. When she is nauseated she can feel lightheaded. She then takes Zofran which resolves the symptoms. Today she reports she was more nauseated than normal and the Zofran took longer to work. At the time of evaluation she is no longer nauseated. Patient denies vomiting. The patient reports she is having left flank pain for the past year. The patient had lithotripsy on 05/17/2014 on her left side. The patient reports her pain is improved since the lithotripsy and does not feel different today. The patient is also complaining of general abdominal pain for the past year. The patient rates her pain at 7 out of 10, and this is unchanged over the past year. He also reports some decreased appetite over the past year. The patient reports she last ate last night. The patient reports she has an appointment with her primary care doctor tomorrow for blood work. The patient reports that she has an appointment with her urologist in 2 days. Patient denies fevers, chills, vomiting, diarrhea, cough, chest pain, shortness of breath, palpitations, rashes, dysuria, hematuria, melena, or hematochezia.  (Consider location/radiation/quality/duration/timing/severity/associated sxs/prior Treatment) HPI  Past Medical History  Diagnosis Date  . Hyperlipidemia   . GERD (gastroesophageal reflux disease)   . H/O hiatal hernia   . History of MI (myocardial infarction)     1995 S/P  BMS to RCA  . Right ureteral stone   . History of kidney  stones   . Coronary atherosclerosis of native coronary vessel cardiologist-  dr Clifton James    hx  MI 1995--  BMS X1 to RCA   . S/p bare metal coronary artery stent     1995 to  RCA  . COPD with emphysema   . Dyspnea on exertion   . Smokers' cough   . Productive cough   . RLS (restless legs syndrome)   . Arthritis   . Wears dentures     UPPER  . Hematuria   . Chronic kidney disease   . Renal stone 04/2014   Past Surgical History  Procedure Laterality Date  . Extracorporeal shock wave lithotripsy Right 02-18-2014  . Cardiovascular stress test  10-29-2011  DR [facility]    abnormal nuclear study with fixed moderate basal inferolateral wall defect suggestive of prior MI / mild hypokinesis/  no ischemia/  ef 63%  . Coronary angioplasty with stent placement  1995       BMS X1  to mRCA  . Cardiac catheterization  03-11-2006  dr Eden Emms    LAD  20-30%/  mCFX  20%/   RCA  with widely patent stent,  PDA  20-30%/  EF 60%  . Thyroidectomy, partial  age 67 (approx)    for goiter , benign  . Total abdominal hysterectomy w/ bilateral salpingoophorectomy  age 75's  . Cholecystectomy  yrs ago  . Cystoscopy with ureteroscopy and stent placement Right 03/08/2014    Procedure: RIGHT URETEROSCOPY AND STENT PLACEMENT WITH DILATION OF INFANDIBULAR STENOSIS;  Surgeon: Garnett FarmMark C Ottelin, MD;  [facility];  Service: Urology;  Laterality: Right;  . Holmium laser application Right 03/08/2014    Procedure: HOLMIUM LASER LITHO;  Surgeon: Garnett FarmMark C Ottelin, MD;  [facility];  Service: Urology;  Laterality: Right;   Family History  Problem Relation Age of Onset  . Heart attack Father   . Heart attack Mother   . Thyroid disease Sister    History  Substance Use Topics  . Smoking status: Current Every Day Smoker -- 0.50 packs/day for 56 years    Types: Cigarettes  . Smokeless tobacco: Never Used  . Alcohol Use: No   OB History    No data available     Review of  Systems  Constitutional: Negative for fever and chills.  HENT: Negative for congestion, ear pain, mouth sores, sore throat and trouble swallowing.   Eyes: Negative for pain, redness and visual disturbance.  Respiratory: Negative for cough, shortness of breath and wheezing.   Cardiovascular: Negative for chest pain and palpitations.  Gastrointestinal: Positive for nausea and abdominal pain. Negative for vomiting and diarrhea.  Genitourinary: Negative for dysuria, urgency, frequency, hematuria and difficulty urinating.  Musculoskeletal: Negative for back pain and neck pain.  Skin: Negative for rash and wound.  Neurological: Positive for light-headedness. Negative for dizziness, syncope, weakness, numbness and headaches.  All other systems reviewed and are negative.     Allergies  Contrast media  Home Medications   Prior to Admission medications   Medication Sig Start Date End Date Taking? Authorizing Provider  albuterol (PROVENTIL HFA;VENTOLIN HFA) 108 (90 BASE) MCG/ACT inhaler Inhale 2 puffs into the lungs daily.    Yes Historical Provider, MD  atorvastatin (LIPITOR) 80 MG tablet Take 80 mg by mouth every evening.    Yes Historical Provider, MD  budesonide-formoterol (SYMBICORT) 80-4.5 MCG/ACT inhaler Inhale 2 puffs into the lungs 2 (two) times daily.   Yes Historical Provider, MD  DALIRESP 500 MCG TABS tablet Take 500 mcg by mouth every other day.  11/19/13  Yes Historical Provider, MD  famotidine (PEPCID) 20 MG tablet Take 20 mg by mouth at bedtime.    Yes Historical Provider, MD  gabapentin (NEURONTIN) 400 MG capsule Take 400 mg by mouth 3 (three) times daily.    Yes Historical Provider, MD  ibuprofen (ADVIL,MOTRIN) 600 MG tablet Take 1 tablet (600 mg total) by mouth every 6 (six) hours as needed. 05/06/14  Yes Raeford RazorStephen Kohut, MD  isosorbide mononitrate (IMDUR) 120 MG 24 hr tablet Take 120 mg by mouth every morning.   Yes Historical Provider, MD  nitroGLYCERIN (NITROSTAT) 0.4 MG SL  tablet Place 1 tablet (0.4 mg total) under the tongue every 5 (five) minutes as needed for chest pain. 12/01/13  Yes Duke SalviaSteven C Klein, MD  ondansetron (ZOFRAN-ODT) 4 MG disintegrating tablet Take 4 mg by mouth 4 (four) times daily as needed for nausea or vomiting.   Yes Historical Provider, MD  oxyCODONE-acetaminophen (PERCOCET/ROXICET) 5-325 MG per tablet Take 0.5-2 tablets by mouth every 4 (four) hours as needed for severe pain.   Yes Historical Provider, MD  LORazepam (ATIVAN) 1 MG tablet Take 1 tablet (1 mg total) by mouth every 8 (eight) hours as needed (muscle spasm). Patient not taking: Reported on 06/13/2014 05/10/14   Flint MelterElliott L Wentz, MD  ondansetron (ZOFRAN) 4 MG tablet Take 1 tablet (4 mg total) by mouth every 8 (eight) hours as needed for nausea or vomiting. Patient not taking: Reported on  06/13/2014 05/17/14   Garnett FarmMark C Ottelin, MD  oxyCODONE-acetaminophen (PERCOCET) 10-325 MG per tablet Take 1-2 tablets by mouth every 4 (four) hours as needed for pain. Maximum dose per 24 hours - 8 pills Patient not taking: Reported on 06/07/2014 05/17/14   Garnett FarmMark C Ottelin, MD  tamsulosin (FLOMAX) 0.4 MG CAPS capsule Take 1 capsule (0.4 mg total) by mouth daily after supper. Patient not taking: Reported on 06/07/2014 05/17/14   Garnett FarmMark C Ottelin, MD   BP 129/63 mmHg  Pulse 82  Temp(Src) 98.3 F (36.8 C) (Oral)  Resp 20  SpO2 96% Physical Exam  Constitutional: She appears well-developed and well-nourished. No distress.  HENT:  Head: Normocephalic and atraumatic.  Right Ear: External ear normal.  Left Ear: External ear normal.  Nose: Nose normal.  Mouth/Throat: Oropharynx is clear and moist. No oropharyngeal exudate.  Eyes: Conjunctivae are normal. Pupils are equal, round, and reactive to light. Right eye exhibits no discharge. Left eye exhibits no discharge.  Neck: Neck supple.  Cardiovascular: Normal rate, regular rhythm, normal heart sounds and intact distal pulses.  Exam reveals no gallop and no  friction rub.   No murmur heard. Pulmonary/Chest: Effort normal and breath sounds normal. No respiratory distress. She has no wheezes. She has no rales.  Abdominal: Soft. Bowel sounds are normal. She exhibits no distension and no mass. There is tenderness. There is no rebound and no guarding.  Patient's abdomen is soft. The patient's abdomen is diffusely mildly tender to palpation. Negative psoas sign. Negative obturator sign. No rebound tenderness. Negative Rovsing sign.  Musculoskeletal: She exhibits no edema.  No lower extremity edema.   Lymphadenopathy:    She has no cervical adenopathy.  Neurological: She is alert. Coordination normal.  Skin: Skin is warm and dry. No rash noted. She is not diaphoretic. No erythema. No pallor.  Psychiatric: She has a normal mood and affect. Her behavior is normal.  Nursing note and vitals reviewed.   ED Course  Procedures (including critical care time) Labs Review Labs Reviewed  CBC WITH DIFFERENTIAL - Abnormal; Notable for the following:    RBC 5.22 (*)    Hemoglobin 15.1 (*)    HCT 46.5 (*)    All other components within normal limits  COMPREHENSIVE METABOLIC PANEL - Abnormal; Notable for the following:    Glucose, Bld 118 (*)    BUN 5 (*)    Alkaline Phosphatase 132 (*)    GFR calc non Af Amer 83 (*)    All other components within normal limits  LIPASE, BLOOD - Abnormal; Notable for the following:    Lipase 156 (*)    All other components within normal limits  URINALYSIS, ROUTINE W REFLEX MICROSCOPIC    Imaging Review Ct Abdomen Pelvis Wo Contrast  06/13/2014   CLINICAL DATA:  Left flank pain, nausea and tremors for the past year. History of nephrolithiasis. Previous cholecystectomy, appendectomy, hysterectomy and oophorectomy.  EXAM: CT ABDOMEN AND PELVIS WITHOUT CONTRAST  TECHNIQUE: Multidetector CT imaging of the abdomen and pelvis was performed following the standard protocol without IV contrast.  COMPARISON:  04/26/2014.  FINDINGS:  Multiple small upper and lower pole left renal calculi. The largest is in the lower pole, measuring 4 mm in maximum diameter. There is also small upper pole right renal calculus measuring 4 mm in maximum diameter and additional tiny upper pole right renal calculus. No bladder or ureteral calculi and no hydronephrosis. The previously seen left UVJ calculus is no longer demonstrated.  Bilateral posterior  diaphragmatic hernias containing fat are again demonstrated. Unremarkable non contrasted appearance of the liver, spleen, pancreas and adrenal glands. Surgically absent gallbladder, uterus and ovaries. No gastrointestinal abnormalities or enlarged lymph nodes. Surgically absent appendix. Clear lung bases. Atheromatous arterial calcifications. Lumbar and lower thoracic spine degenerative changes.  IMPRESSION: 1. Small, bilateral, nonobstructing renal calculi. 2. Small to moderate sized right posterior diaphragmatic hernia containing herniated fat and small left posterior diaphragmatic hernia containing herniated fat. 3. No acute abnormality.   Electronically Signed   By: Gordan Payment M.D.   On: 06/13/2014 14:16     EKG Interpretation   Date/Time:  Sunday June 13 2014 12:30:53 EST Ventricular Rate:  74 PR Interval:  118 QRS Duration: 68 QT Interval:  406 QTC Calculation: 450 R Axis:   81 Text Interpretation:  Sinus rhythm Borderline short PR interval Probable  left atrial enlargement Borderline right axis deviation since last tracing  no significant change Confirmed by BELFI  MD, MELANIE ([id number]) on  06/13/2014 12:38:04 PM      Filed Vitals:   06/13/14 1056 06/13/14 1455  BP: 150/67 129/63  Pulse: 84 82  Temp: 98.2 F (36.8 C) 98.3 F (36.8 C)  TempSrc: Oral Oral  Resp: 16 20  SpO2: 100% 96%     MDM   Meds given in ED:  Medications  iohexol (OMNIPAQUE) 300 MG/ML solution 50 mL (50 mLs Oral Contrast Given 06/13/14 1253)    Discharge Medication List as of 06/13/2014  3:14 PM       Final diagnoses:  Abdominal pain  Nausea  Left flank pain, chronic   [name] is a 68 y.o. female with a history of GERD, MI, kidney stones, COPD and chronic abdominal pain who presents to the emergency department complaining of increased nausea. Patient reports she's been feeling ill for a year. Patient has chronic left flank pain. Patient is afebrile and nontoxic-appearing. The patient's urinalysis, CBC and CMP are unremarkable. Patient's lipase was slightly elevated at 156. Patient's CT abdomen with oral contrast showed small bilateral nonobstructing renal calculi. He also indicated a small-to-moderate size right posterior diaphragmatic hernia containing herniated fat. There is no acute abnormality on the patient's CT scan. The patient is resting complaint bed. The patient is no longer nauseated. The patient reports she's no longer shaking. Patient is tolerating by mouth water and crackers. Patient is afebrile and nontoxic appearing. We'll discharge this patient at this time. The patient has an appointment with her primary care provider tomorrow. Patient has follow-up appointment with urology in 2 days. I discussed laboratory and radiology findings with the patient. The patient feels ready to be discharged at this time. The patient reports she does not require any Zofran or pain medicine at home as she has plenty currently.  I advised patient to keep her follow-up appointment with her primary care physician tomorrow. Advised her to keep her follow-up appointment with her urologist in 2 days. Advised patient return to the emergency department with new or worsening symptoms or new concerns. Patient and her family verbalize understanding and agreement with plan.  This patient was discussed with and evaluated by Dr. Fredderick Phenix who agrees with assessment and plan.      Lawana Chambers, PA-C 06/13/14 8295  Rolan Bucco, MD 06/15/14 [phone]

## 2014-06-13 NOTE — ED Notes (Signed)
Pt states she has been "sick for a year". States she has kidney stones and reports lt sided flank pain, nausea, and "shakiness" x 1 year.

## 2014-07-19 ENCOUNTER — Encounter: Payer: Self-pay | Admitting: Endocrinology

## 2014-07-22 ENCOUNTER — Other Ambulatory Visit: Payer: Self-pay | Admitting: Cardiovascular Disease

## 2014-07-23 DIAGNOSIS — F341 Dysthymic disorder: Secondary | ICD-10-CM | POA: Diagnosis not present

## 2014-07-23 DIAGNOSIS — K30 Functional dyspepsia: Secondary | ICD-10-CM | POA: Diagnosis not present

## 2014-07-23 DIAGNOSIS — R251 Tremor, unspecified: Secondary | ICD-10-CM | POA: Diagnosis not present

## 2014-07-23 DIAGNOSIS — R634 Abnormal weight loss: Secondary | ICD-10-CM | POA: Diagnosis not present

## 2014-09-19 ENCOUNTER — Other Ambulatory Visit: Payer: Self-pay | Admitting: Cardiovascular Disease

## 2014-10-20 DIAGNOSIS — J449 Chronic obstructive pulmonary disease, unspecified: Secondary | ICD-10-CM | POA: Diagnosis not present

## 2014-11-10 DIAGNOSIS — E780 Pure hypercholesterolemia: Secondary | ICD-10-CM | POA: Diagnosis not present

## 2014-11-10 DIAGNOSIS — R7301 Impaired fasting glucose: Secondary | ICD-10-CM | POA: Diagnosis not present

## 2014-11-15 ENCOUNTER — Other Ambulatory Visit: Payer: Self-pay | Admitting: Cardiovascular Disease

## 2014-11-19 ENCOUNTER — Other Ambulatory Visit: Payer: Self-pay | Admitting: Cardiovascular Disease

## 2014-12-06 ENCOUNTER — Encounter: Payer: Self-pay | Admitting: Cardiovascular Disease

## 2014-12-06 ENCOUNTER — Ambulatory Visit (INDEPENDENT_AMBULATORY_CARE_PROVIDER_SITE_OTHER): Payer: Commercial Managed Care - HMO | Admitting: Cardiovascular Disease

## 2014-12-06 VITALS — BP 132/70 | HR 87 | Ht 67.0 in | Wt 125.4 lb

## 2014-12-06 DIAGNOSIS — I25110 Atherosclerotic heart disease of native coronary artery with unstable angina pectoris: Secondary | ICD-10-CM

## 2014-12-06 DIAGNOSIS — R002 Palpitations: Secondary | ICD-10-CM

## 2014-12-06 DIAGNOSIS — I10 Essential (primary) hypertension: Secondary | ICD-10-CM | POA: Diagnosis not present

## 2014-12-06 DIAGNOSIS — E785 Hyperlipidemia, unspecified: Secondary | ICD-10-CM | POA: Diagnosis not present

## 2014-12-06 DIAGNOSIS — Z720 Tobacco use: Secondary | ICD-10-CM

## 2014-12-06 NOTE — Progress Notes (Signed)
Chief Complaint  Patient presents with  . Shortness of Breath  . Chest Pain  . Palpitations   History of Present Illness: 69 yo female with history of CAD, HLD, COPD, GERD here today for cardiac follow up. She has been followed in the past by Dr. Daleen Squibb. She has a history of CAD, status post bare metal stent RCA in 1995. Last cardiac cath 02/2006: LAD 20-30%, circumflex 20% mid, RCA widely patent stent in the mid vessel, PDA 20-30%, EF 60%. She was seen by Tereso Newcomer, PA-C April 2013 and had c/o jaw pain, LE edema and dyspnea. Stress myoview 10/29/11 with fixed defect inferior wall c/w scar, no ischemia. Edema resolved with Lasix.   She is here today for follow up. She tells me that she is not doing well. She had an episode of severe chest pain one week ago and has had soreness in chest since then. Her chest wall is painful to touch. She has no appetite. Overall no energy and still having trouble with kidney stones. She also notes palpitations occurring several days per week. She is not a good historian but it seems that her heart is racing. She has a hard time differentiating palpitations from the chest wall pain. She is still smoking 1/2 ppd. She has been off of ASA with recent issues with kidney stones.   Primary Care Physician: [facility]   Past Medical History  Diagnosis Date  . Hyperlipidemia   . GERD (gastroesophageal reflux disease)   . H/O hiatal hernia   . History of MI (myocardial infarction)     1995 S/P  BMS to RCA  . Right ureteral stone   . History of kidney stones   . Coronary atherosclerosis of native coronary vessel cardiologist-  dr Clifton James    hx  MI 1995--  BMS X1 to RCA   . S/p bare metal coronary artery stent     1995 to  RCA  . COPD with emphysema   . Dyspnea on exertion   . Smokers' cough   . Productive cough   . RLS (restless legs syndrome)   . Arthritis   . Wears dentures     UPPER  . Hematuria   . Chronic kidney disease   . Renal stone  04/2014    Past Surgical History  Procedure Laterality Date  . Extracorporeal shock wave lithotripsy Right 02-18-2014  . Cardiovascular stress test  10-29-2011  DR [facility]    abnormal nuclear study with fixed moderate basal inferolateral wall defect suggestive of prior MI / mild hypokinesis/  no ischemia/  ef 63%  . Coronary angioplasty with stent placement  1995       BMS X1  to mRCA  . Cardiac catheterization  03-11-2006  dr Eden Emms    LAD  20-30%/  mCFX  20%/   RCA  with widely patent stent,  PDA  20-30%/  EF 60%  . Thyroidectomy, partial  age 62 (approx)    for goiter , benign  . Total abdominal hysterectomy w/ bilateral salpingoophorectomy  age 80's  . Cholecystectomy  yrs ago  . Cystoscopy with ureteroscopy and stent placement Right 03/08/2014    Procedure: RIGHT URETEROSCOPY AND STENT PLACEMENT WITH DILATION OF INFANDIBULAR STENOSIS;  Surgeon: Garnett Farm, MD;  Location: [facility];  Service: Urology;  Laterality: Right;  . Holmium laser application Right 03/08/2014    Procedure: HOLMIUM LASER LITHO;  Surgeon: Garnett Farm, MD;  Location: [facility];  Service: Urology;  Laterality: Right;    Current Outpatient Prescriptions  Medication Sig Dispense Refill  . atorvastatin (LIPITOR) 80 MG tablet TAKE 1 TABLET BY MOUTH EVERY DAY 30 tablet 1  . budesonide-formoterol (SYMBICORT) 80-4.5 MCG/ACT inhaler Inhale 2 puffs into the lungs 2 (two) times daily.    Marland Kitchen. gabapentin (NEURONTIN) 400 MG capsule Take 400 mg by mouth 3 (three) times daily.     . isosorbide mononitrate (IMDUR) 120 MG 24 hr tablet TAKE 1 TABLET BY MOUTH EVERY DAY 30 tablet 0  . nitroGLYCERIN (NITROSTAT) 0.4 MG SL tablet Place 1 tablet (0.4 mg total) under the tongue every 5 (five) minutes as needed for chest pain. 25 tablet 8  . omeprazole (PRILOSEC) 40 MG capsule Take 40 mg by mouth daily.  3  . ondansetron (ZOFRAN) 4 MG tablet Take 4 mg by mouth every 6 (six) hours as needed for nausea  or vomiting.    Marland Kitchen. OVER THE COUNTER MEDICATION Take 1 capsule by mouth daily.    Marland Kitchen. oxyCODONE-acetaminophen (PERCOCET/ROXICET) 5-325 MG per tablet Take 0.5-2 tablets by mouth every 4 (four) hours as needed for severe pain.    . Vitamin D, Ergocalciferol, (DRISDOL) 50000 UNITS CAPS capsule ONE CAPSULE BY MOUTH TWICE A WEEK  2   No current facility-administered medications for this visit.    Allergies  Allergen Reactions  . Contrast Media [Iodinated Diagnostic Agents] Shortness Of Breath    History   Social History  . Marital Status: Married    Spouse Name: N/A  . Number of Children: 1  . Years of Education: N/A   Occupational History  . DISABILITY    Social History Main Topics  . Smoking status: Current Every Day Smoker -- 0.50 packs/day for 56 years    Types: Cigarettes  . Smokeless tobacco: Never Used  . Alcohol Use: No  . Drug Use: No  . Sexual Activity: Not on file   Other Topics Concern  . Not on file   Social History Narrative    Family History  Problem Relation Age of Onset  . Heart attack Father   . Heart attack Mother   . Thyroid disease Sister   . Kidney disease Brother   . Breast cancer Sister   . Crohn's disease Sister     Review of Systems:  As stated in the HPI and otherwise negative.   BP 132/70 mmHg  Pulse 87  Ht 5\' 7"  (1.702 m)  Wt 125 lb 6.4 oz (56.881 kg)  BMI 19.64 kg/m2  SpO2 96%  Physical Examination: General: Well developed, well nourished, NAD HEENT: OP clear, mucus membranes moist SKIN: warm, dry. No rashes. Neuro: No focal deficits Musculoskeletal: Muscle strength 5/5 all ext Psychiatric: Mood and affect normal Neck: No JVD, no carotid bruits, no thyromegaly, no lymphadenopathy. Lungs:Clear bilaterally, no wheezes, rhonci, crackles Cardiovascular: Regular rate and rhythm. No murmurs, gallops or rubs. Abdomen:Soft. Bowel sounds present. Non-tender.  Extremities: No lower extremity edema. Pulses are 2 + in the bilateral  DP/PT.  Stress myoview April 2013: Exercise Capacity: Lexiscan with no exercise.  BP Response: Normal blood pressure response.  Clinical Symptoms: Dyspnea, chest pressure.  ECG Impression: No significant ST segment change suggestive of ischemia.  Comparison with Prior Nuclear Study: No significant change from previous study  Overall Impression: Abnormal stress nuclear study. Fixed, medium-sized moderate basal to mid inferolateral perfusion defect suggestive of prior MI. No evidence for significant ischemia.  LV Ejection Fraction: 63%. LV Wall Motion: Mild  basal inferolateral hypokinesis.   EKG:  EKG is not ordered today. The ekg ordered today demonstrates NSR, rate 75 bpm. Non-specific ST abnormality  Recent Labs: 06/13/2014: ALT 8; BUN 5*; Creatinine 0.79; Hemoglobin 15.1*; Platelets 192; Potassium 4.8; Sodium 145   Lipid Panel    Component Value Date/Time   CHOL 135 11/27/2013 1536   TRIG 146.0 11/27/2013 1536   HDL 39.70 11/27/2013 1536   CHOLHDL 3 11/27/2013 1536   VLDL 29.2 11/27/2013 1536   LDLCALC 66 11/27/2013 1536     Wt Readings from Last 3 Encounters:  12/06/14 125 lb 6.4 oz (56.881 kg)  06/07/14 126 lb 12.8 oz (57.516 kg)  05/17/14 129 lb 4 oz (58.627 kg)     Other studies Reviewed: Additional studies/ records that were reviewed today include: . Review of the above records demonstrates:    Assessment and Plan:   1. CAD/Unstable angina: She has known CAD with remote stenting of the RCA. Last cath in 2007 with patent RCA stent and minimal plaque in the LAD and Circumflex. Her chest pain seems to be more musculoskeletal but she is a poor historian and I cannot exclude angina. Will arrange Lexiscan stress myoview to exclude ischemia. (of note, stress myoview in 2013 with inferior wall scar). Continue current medical therapy.   2. Hyperlipidemia: Continue statin. Lipids well controlled.   3. HTN: BP controlled today. No changes.   4. Palpitations: Will arrange 48  hour monitor to exclude arrhythmias as her symptoms occur every day. EKG is ok today, sinus.   5. Tobacco abuse: Smoking cessation is advised.   Current medicines are reviewed at length with the patient today.  The patient does not have concerns regarding medicines.  The following changes have been made:  no change  Labs/ tests ordered today include:   Orders Placed This Encounter  Procedures  . Holter monitor - 48 hour  . Myocardial Perfusion Imaging  . EKG 12-Lead    Disposition:   FU with me in 3 weeks  Signed, Verne Carrow, MD 12/06/2014 4:52 PM    [facility] [address] [place], [place], [place]  [address] Phone: [phone]; Fax: [phone]

## 2014-12-06 NOTE — Patient Instructions (Addendum)
Medication Instructions:  Your physician recommends that you continue on your current medications as directed. Please refer to the Current Medication list given to you today.   Labwork: none  Testing/Procedures: Your physician has requested that you have a lexiscan myoview. For further information please visit [email or web address]. Please follow instruction sheet, as given.  Your physician has recommended that you wear a holter monitor. Holter monitors are medical devices that record the heart's electrical activity. Doctors most often use these monitors to diagnose arrhythmias. Arrhythmias are problems with the speed or rhythm of the heartbeat. The monitor is a small, portable device. You can wear one while you do your normal daily activities. This is usually used to diagnose what is causing palpitations/syncope (passing out).    Follow-Up:  Your physician recommends that you schedule a follow-up appointment in: about 4 weeks with Dr. Arvilla MarketMcAlhany--Scheduled for January 07, 2015 at 12:00

## 2014-12-21 DIAGNOSIS — N200 Calculus of kidney: Secondary | ICD-10-CM | POA: Diagnosis not present

## 2014-12-22 ENCOUNTER — Encounter: Payer: Self-pay | Admitting: Internal Medicine

## 2014-12-22 ENCOUNTER — Telehealth (HOSPITAL_COMMUNITY): Payer: Self-pay

## 2014-12-22 ENCOUNTER — Encounter: Payer: Self-pay | Admitting: Cardiovascular Disease

## 2014-12-22 NOTE — Telephone Encounter (Signed)
Patient given detailed instructions per Myocardial Perfusion Study Information Sheet for test on 12-27-2014 at 11:45am. Patient verbalized understanding. Randa EvensEdwards, Quanell Loughney A

## 2014-12-23 ENCOUNTER — Ambulatory Visit (INDEPENDENT_AMBULATORY_CARE_PROVIDER_SITE_OTHER): Payer: Commercial Managed Care - HMO

## 2014-12-23 DIAGNOSIS — R002 Palpitations: Secondary | ICD-10-CM

## 2014-12-23 DIAGNOSIS — I25110 Atherosclerotic heart disease of native coronary artery with unstable angina pectoris: Secondary | ICD-10-CM

## 2014-12-27 ENCOUNTER — Ambulatory Visit (HOSPITAL_COMMUNITY): Payer: Commercial Managed Care - HMO | Attending: Cardiovascular Disease

## 2014-12-27 ENCOUNTER — Other Ambulatory Visit: Payer: Self-pay | Admitting: Cardiovascular Disease

## 2014-12-27 DIAGNOSIS — I2510 Atherosclerotic heart disease of native coronary artery without angina pectoris: Secondary | ICD-10-CM

## 2014-12-27 DIAGNOSIS — I25110 Atherosclerotic heart disease of native coronary artery with unstable angina pectoris: Secondary | ICD-10-CM | POA: Diagnosis not present

## 2014-12-27 MED ORDER — REGADENOSON 0.4 MG/5ML IV SOLN
0.4000 mg | Freq: Once | INTRAVENOUS | Status: AC
  Administered 2014-12-27: 0.4 mg via INTRAVENOUS

## 2014-12-27 MED ORDER — TECHNETIUM TC 99M SESTAMIBI GENERIC - CARDIOLITE
11.0000 | Freq: Once | INTRAVENOUS | Status: AC | PRN
  Administered 2014-12-27: 11 via INTRAVENOUS

## 2014-12-27 MED ORDER — TECHNETIUM TC 99M SESTAMIBI GENERIC - CARDIOLITE
33.0000 | Freq: Once | INTRAVENOUS | Status: AC | PRN
  Administered 2014-12-27: 33 via INTRAVENOUS

## 2014-12-28 LAB — MYOCARDIAL PERFUSION IMAGING
CHL CUP NUCLEAR SDS: 1
CHL CUP NUCLEAR SRS: 15
CHL CUP RESTING HR STRESS: 72 {beats}/min
CSEPPHR: 109 {beats}/min
LV sys vol: 42 mL
LVDIAVOL: 88 mL
NUC STRESS TID: 1.1
Nuc Stress EF: 53 %
RATE: 0.33
SSS: 16

## 2015-01-05 ENCOUNTER — Telehealth: Payer: Self-pay | Admitting: *Deleted

## 2015-01-05 MED ORDER — METOPROLOL TARTRATE 25 MG PO TABS: 25.0000 mg | ORAL_TABLET | Freq: Two times a day (BID) | ORAL | Status: DC

## 2015-01-05 NOTE — Telephone Encounter (Signed)
Follow Up      Returning Pat's call

## 2015-01-05 NOTE — Telephone Encounter (Signed)
[name], MD  Sent: Mon January 03, 2015 9:17 AM   To: Dossie Arbour, RN     Message    Monitor shows PVCs and PACs with several ventricular couplets. Can we let her know and if she is willing, we could start Lopressor 25 mg po BID which may help. Thayer Ohm   I placed call to pt to review results and left message to call back

## 2015-01-05 NOTE — Telephone Encounter (Signed)
Spoke with pt and reviewed monitor results and recommendations from Dr. Clifton James with her.  She would like to start lopressor 25 mg twice daily.  Will send prescription to [facility] in [place].

## 2015-01-07 ENCOUNTER — Ambulatory Visit (INDEPENDENT_AMBULATORY_CARE_PROVIDER_SITE_OTHER): Payer: Commercial Managed Care - HMO | Admitting: Cardiovascular Disease

## 2015-01-07 VITALS — BP 100/60 | HR 64 | Ht 67.0 in | Wt 128.0 lb

## 2015-01-07 DIAGNOSIS — I2510 Atherosclerotic heart disease of native coronary artery without angina pectoris: Secondary | ICD-10-CM

## 2015-01-07 DIAGNOSIS — Z720 Tobacco use: Secondary | ICD-10-CM | POA: Diagnosis not present

## 2015-01-07 DIAGNOSIS — I10 Essential (primary) hypertension: Secondary | ICD-10-CM | POA: Diagnosis not present

## 2015-01-07 DIAGNOSIS — E785 Hyperlipidemia, unspecified: Secondary | ICD-10-CM | POA: Diagnosis not present

## 2015-01-07 DIAGNOSIS — R002 Palpitations: Secondary | ICD-10-CM | POA: Diagnosis not present

## 2015-01-07 MED ORDER — NITROGLYCERIN 0.4 MG SL SUBL: 0.4000 mg | SUBLINGUAL_TABLET | SUBLINGUAL | Status: DC | PRN

## 2015-01-07 NOTE — Patient Instructions (Signed)
Medication Instructions:  Your physician recommends that you continue on your current medications as directed. Please refer to the Current Medication list given to you today.   Labwork: none  Testing/Procedures: none  Follow-Up:   Your physician wants you to follow-up in:  3-4 months.  You will receive a reminder letter in the mail two months in advance. If you don't receive a letter, please call our office to schedule the follow-up appointment.

## 2015-01-07 NOTE — Progress Notes (Signed)
Chief Complaint  Patient presents with  . Palpitations   History of Present Illness: 69 yo female with history of CAD, HLD, COPD, GERD here today for cardiac follow up. She has been followed in the past by Dr. Daleen Squibb. She has a history of CAD, status post bare metal stent RCA in 1995. Last cardiac cath 02/2006: LAD 20-30%, circumflex 20% mid, RCA widely patent stent in the mid vessel, PDA 20-30%, EF 60%. She was seen by Tereso Newcomer, PA-C April 2013 and had c/o jaw pain, LE edema and dyspnea. Stress myoview 10/29/11 with fixed defect inferior wall c/w scar, no ischemia. Edema resolved with Lasix. I saw her 12/06/14 and she c/o one episode of severe chest pain. Her chest wall was painful to touch. She also noted palpitations occurring several days per week. She is still smoking 1/2 ppd. She has been off of ASA with recent issues with kidney stones. I arranged a stress myoview which showed an inferior fixed defect but no large areas of ischemia. Monitor with PVCs. Lopressor was started.   She is here today for follow up and is feeling better. No palpitations. No recurrent chest pain. She is having some dyspnea at her baseline.    Primary Care Physician: [facility]   Past Medical History  Diagnosis Date  . Hyperlipidemia   . GERD (gastroesophageal reflux disease)   . H/O hiatal hernia   . History of MI (myocardial infarction)     1995 S/P  BMS to RCA  . Right ureteral stone   . History of kidney stones   . Coronary atherosclerosis of native coronary vessel cardiologist-  dr Clifton James    hx  MI 1995--  BMS X1 to RCA   . S/p bare metal coronary artery stent     1995 to  RCA  . COPD with emphysema   . Dyspnea on exertion   . Smokers' cough   . Productive cough   . RLS (restless legs syndrome)   . Arthritis   . Wears dentures     UPPER  . Hematuria   . Chronic kidney disease   . Renal stone 04/2014    Past Surgical History  Procedure Laterality Date  . Extracorporeal shock  wave lithotripsy Right 02-18-2014  . Cardiovascular stress test  10-29-2011  DR [facility]    abnormal nuclear study with fixed moderate basal inferolateral wall defect suggestive of prior MI / mild hypokinesis/  no ischemia/  ef 63%  . Coronary angioplasty with stent placement  1995       BMS X1  to mRCA  . Cardiac catheterization  03-11-2006  dr Eden Emms    LAD  20-30%/  mCFX  20%/   RCA  with widely patent stent,  PDA  20-30%/  EF 60%  . Thyroidectomy, partial  age 26 (approx)    for goiter , benign  . Total abdominal hysterectomy w/ bilateral salpingoophorectomy  age 62's  . Cholecystectomy  yrs ago  . Cystoscopy with ureteroscopy and stent placement Right 03/08/2014    Procedure: RIGHT URETEROSCOPY AND STENT PLACEMENT WITH DILATION OF INFANDIBULAR STENOSIS;  Surgeon: Garnett Farm, MD;  Location: [facility];  Service: Urology;  Laterality: Right;  . Holmium laser application Right 03/08/2014    Procedure: HOLMIUM LASER LITHO;  Surgeon: Garnett Farm, MD;  Location: [facility];  Service: Urology;  Laterality: Right;    Current Outpatient Prescriptions  Medication Sig Dispense Refill  . atorvastatin (LIPITOR)  80 MG tablet TAKE 1 TABLET BY MOUTH EVERY DAY 30 tablet 1  . budesonide-formoterol (SYMBICORT) 80-4.5 MCG/ACT inhaler Inhale 2 puffs into the lungs 2 (two) times daily.    Marland Kitchen gabapentin (NEURONTIN) 400 MG capsule Take 400 mg by mouth 3 (three) times daily.     . isosorbide mononitrate (IMDUR) 120 MG 24 hr tablet TAKE 1 TABLET BY MOUTH EVERY DAY 30 tablet 11  . metoprolol tartrate (LOPRESSOR) 25 MG tablet Take 1 tablet (25 mg total) by mouth 2 (two) times daily. 60 tablet 6  . nitroGLYCERIN (NITROSTAT) 0.4 MG SL tablet Place 1 tablet (0.4 mg total) under the tongue every 5 (five) minutes as needed for chest pain. 25 tablet 6  . omeprazole (PRILOSEC) 40 MG capsule Take 40 mg by mouth daily.  3  . ondansetron (ZOFRAN) 4 MG tablet Take 4 mg by mouth every 6  (six) hours as needed for nausea or vomiting.    Marland Kitchen OVER THE COUNTER MEDICATION Take 1 capsule by mouth daily.    Marland Kitchen oxyCODONE-acetaminophen (PERCOCET/ROXICET) 5-325 MG per tablet Take 0.5-2 tablets by mouth every 4 (four) hours as needed for severe pain.    . Vitamin D, Ergocalciferol, (DRISDOL) 50000 UNITS CAPS capsule ONE CAPSULE BY MOUTH TWICE A WEEK  2   No current facility-administered medications for this visit.    Allergies  Allergen Reactions  . Contrast Media [Iodinated Diagnostic Agents] Shortness Of Breath    History   Social History  . Marital Status: Married    Spouse Name: N/A  . Number of Children: 1  . Years of Education: N/A   Occupational History  . DISABILITY    Social History Main Topics  . Smoking status: Current Every Day Smoker -- 0.50 packs/day for 56 years    Types: Cigarettes  . Smokeless tobacco: Never Used  . Alcohol Use: No  . Drug Use: No  . Sexual Activity: Not on file   Other Topics Concern  . Not on file   Social History Narrative    Family History  Problem Relation Age of Onset  . Heart attack Father   . Heart attack Mother   . Thyroid disease Sister   . Kidney disease Brother   . Breast cancer Sister   . Crohn's disease Sister     Review of Systems:  As stated in the HPI and otherwise negative.   BP 100/60 mmHg  Pulse 64  Ht  (1.702 m)  Wt 128 lb (58.06 kg)  BMI 20.04 kg/m2  SpO2 98%  Physical Examination: General: Well developed, well nourished, NAD HEENT: OP clear, mucus membranes moist SKIN: warm, dry. No rashes. Neuro: No focal deficits Musculoskeletal: Muscle strength 5/5 all ext Psychiatric: Mood and affect normal Neck: No JVD, no carotid bruits, no thyromegaly, no lymphadenopathy. Lungs:Clear bilaterally, no wheezes, rhonci, crackles Cardiovascular: Regular rate and rhythm. No murmurs, gallops or rubs. Abdomen:Soft. Bowel sounds present. Non-tender.  Extremities: No lower extremity edema. Pulses are 2 +  in the bilateral DP/PT.  Stress myoview 12/27/14:  There is a large, severe, fixed defect involving the entire inferolateral, anterolateral, apical lateral and inferoapical walls. There is a small in size, mild fixed defect involving the basal and apical anteroseptal walls. There is no ischemia noted. This is an intermediate risk study.A prior study was conducted on 10/25/2011.  LV cavity size is normal. The left ventricular ejection fraction is mildly decreased (45-54%)  A prior study was conducted on 10/25/2011.Compared to the prior study,  there are changes.The anterolateral fixed defect is new   EKG:  EKG is not ordered today. The ekg ordered today demonstrates NSR, rate 75 bpm. Non-specific ST abnormality  Recent Labs: 06/13/2014: ALT 8; BUN 5*; Creatinine, Ser 0.79; Hemoglobin 15.1*; Platelets 192; Potassium 4.8; Sodium 145   Lipid Panel    Component Value Date/Time   CHOL 135 11/27/2013 1536   TRIG 146.0 11/27/2013 1536   HDL 39.70 11/27/2013 1536   CHOLHDL 3 11/27/2013 1536   VLDL 29.2 11/27/2013 1536   LDLCALC 66 11/27/2013 1536     Wt Readings from Last 3 Encounters:  01/07/15 128 lb (58.06 kg)  12/27/14 125 lb (56.7 kg)  12/06/14 125 lb 6.4 oz (56.881 kg)     Other studies Reviewed: Additional studies/ records that were reviewed today include: . Review of the above records demonstrates:    Assessment and Plan:   1. CAD/Unstable angina: She has known CAD with remote stenting of the RCA. Last cath in 2007 with patent RCA stent and minimal plaque in the LAD and Circumflex. Her chest pain seems to be more musculoskeletal but she is a poor historian. Stress myoview June 2016 without ischemia.  Continue current medical therapy. If symptoms worsen, will need to discuss cardiac cath.   2. Hyperlipidemia: Continue statin. Lipids well controlled.   3. HTN: BP controlled today. No changes.   4. Palpitations/PVCs: Monitor with PVCs. Palpitations improved on Lopressor.   5.  Tobacco abuse: Smoking cessation is advised.   Current medicines are reviewed at length with the patient today.  The patient does not have concerns regarding medicines.  The following changes have been made:  no change  Labs/ tests ordered today include:   No orders of the defined types were placed in this encounter.    Disposition:   FU with me in 3 weeks  Signed, Verne Carrow, MD 01/07/2015 5:15 PM    [facility] [address] [place], [place], [place]  [address] Phone: [phone]; Fax: [phone]

## 2015-01-15 ENCOUNTER — Other Ambulatory Visit: Payer: Self-pay | Admitting: Cardiovascular Disease

## 2015-01-24 DIAGNOSIS — M810 Age-related osteoporosis without current pathological fracture: Secondary | ICD-10-CM | POA: Diagnosis not present

## 2015-02-11 DIAGNOSIS — J449 Chronic obstructive pulmonary disease, unspecified: Secondary | ICD-10-CM | POA: Diagnosis not present

## 2015-02-11 DIAGNOSIS — I2510 Atherosclerotic heart disease of native coronary artery without angina pectoris: Secondary | ICD-10-CM | POA: Diagnosis not present

## 2015-02-11 DIAGNOSIS — R7301 Impaired fasting glucose: Secondary | ICD-10-CM | POA: Diagnosis not present

## 2015-02-11 DIAGNOSIS — E780 Pure hypercholesterolemia: Secondary | ICD-10-CM | POA: Diagnosis not present

## 2015-02-11 DIAGNOSIS — E559 Vitamin D deficiency, unspecified: Secondary | ICD-10-CM | POA: Diagnosis not present

## 2015-03-19 DIAGNOSIS — J189 Pneumonia, unspecified organism: Secondary | ICD-10-CM | POA: Diagnosis not present

## 2015-03-24 DIAGNOSIS — I10 Essential (primary) hypertension: Secondary | ICD-10-CM | POA: Diagnosis not present

## 2015-05-16 DIAGNOSIS — J410 Simple chronic bronchitis: Secondary | ICD-10-CM | POA: Diagnosis not present

## 2015-05-16 DIAGNOSIS — E119 Type 2 diabetes mellitus without complications: Secondary | ICD-10-CM | POA: Diagnosis not present

## 2015-05-16 DIAGNOSIS — E781 Pure hyperglyceridemia: Secondary | ICD-10-CM | POA: Diagnosis not present

## 2015-05-16 DIAGNOSIS — E1142 Type 2 diabetes mellitus with diabetic polyneuropathy: Secondary | ICD-10-CM | POA: Diagnosis not present

## 2015-05-16 DIAGNOSIS — I2510 Atherosclerotic heart disease of native coronary artery without angina pectoris: Secondary | ICD-10-CM | POA: Diagnosis not present

## 2015-05-16 DIAGNOSIS — Z23 Encounter for immunization: Secondary | ICD-10-CM | POA: Diagnosis not present

## 2015-05-16 DIAGNOSIS — M545 Low back pain: Secondary | ICD-10-CM | POA: Diagnosis not present

## 2015-05-16 DIAGNOSIS — F17210 Nicotine dependence, cigarettes, uncomplicated: Secondary | ICD-10-CM | POA: Diagnosis not present

## 2015-05-16 DIAGNOSIS — E784 Other hyperlipidemia: Secondary | ICD-10-CM | POA: Diagnosis not present

## 2015-06-01 DIAGNOSIS — L255 Unspecified contact dermatitis due to plants, except food: Secondary | ICD-10-CM | POA: Diagnosis not present

## 2015-06-01 DIAGNOSIS — B3789 Other sites of candidiasis: Secondary | ICD-10-CM | POA: Diagnosis not present

## 2015-06-17 ENCOUNTER — Ambulatory Visit: Payer: Commercial Managed Care - HMO | Admitting: Cardiovascular Disease

## 2015-07-15 DIAGNOSIS — J440 Chronic obstructive pulmonary disease with acute lower respiratory infection: Secondary | ICD-10-CM | POA: Diagnosis not present

## 2015-07-20 DIAGNOSIS — J440 Chronic obstructive pulmonary disease with acute lower respiratory infection: Secondary | ICD-10-CM | POA: Diagnosis not present

## 2015-08-02 ENCOUNTER — Other Ambulatory Visit: Payer: Self-pay | Admitting: Cardiovascular Disease

## 2015-08-16 DIAGNOSIS — J441 Chronic obstructive pulmonary disease with (acute) exacerbation: Secondary | ICD-10-CM | POA: Diagnosis not present

## 2015-08-16 DIAGNOSIS — R5383 Other fatigue: Secondary | ICD-10-CM | POA: Diagnosis not present

## 2015-08-16 DIAGNOSIS — E782 Mixed hyperlipidemia: Secondary | ICD-10-CM | POA: Diagnosis not present

## 2015-08-16 DIAGNOSIS — I2510 Atherosclerotic heart disease of native coronary artery without angina pectoris: Secondary | ICD-10-CM | POA: Diagnosis not present

## 2015-08-16 DIAGNOSIS — R7301 Impaired fasting glucose: Secondary | ICD-10-CM | POA: Diagnosis not present

## 2015-08-16 DIAGNOSIS — R531 Weakness: Secondary | ICD-10-CM | POA: Diagnosis not present

## 2015-08-16 DIAGNOSIS — B370 Candidal stomatitis: Secondary | ICD-10-CM | POA: Diagnosis not present

## 2015-08-16 DIAGNOSIS — F17210 Nicotine dependence, cigarettes, uncomplicated: Secondary | ICD-10-CM | POA: Diagnosis not present

## 2015-09-07 DIAGNOSIS — R21 Rash and other nonspecific skin eruption: Secondary | ICD-10-CM | POA: Diagnosis not present

## 2015-09-19 ENCOUNTER — Ambulatory Visit (INDEPENDENT_AMBULATORY_CARE_PROVIDER_SITE_OTHER): Payer: Commercial Managed Care - HMO | Admitting: Cardiovascular Disease

## 2015-09-19 ENCOUNTER — Encounter: Payer: Self-pay | Admitting: Cardiovascular Disease

## 2015-09-19 VITALS — BP 110/62 | HR 59 | Ht 67.0 in | Wt 140.1 lb

## 2015-09-19 DIAGNOSIS — Z720 Tobacco use: Secondary | ICD-10-CM | POA: Diagnosis not present

## 2015-09-19 DIAGNOSIS — I25119 Atherosclerotic heart disease of native coronary artery with unspecified angina pectoris: Secondary | ICD-10-CM

## 2015-09-19 DIAGNOSIS — R002 Palpitations: Secondary | ICD-10-CM | POA: Diagnosis not present

## 2015-09-19 DIAGNOSIS — E785 Hyperlipidemia, unspecified: Secondary | ICD-10-CM | POA: Diagnosis not present

## 2015-09-19 DIAGNOSIS — I10 Essential (primary) hypertension: Secondary | ICD-10-CM | POA: Diagnosis not present

## 2015-09-19 NOTE — Patient Instructions (Signed)

## 2015-09-19 NOTE — Progress Notes (Signed)
Chief Complaint  Patient presents with  . Follow-up    pt c/o SOB   . Coronary Artery Disease  . Hypertension   History of Present Illness: 70 yo female with history of CAD, HLD, COPD, GERD here today for cardiac follow up. She has been followed in the past by Dr. Daleen SquibbWall. She has a history of CAD, status post bare metal stent RCA in 1995. Last cardiac cath 02/2006: LAD 20-30%, circumflex 20% mid, RCA widely patent stent in the mid vessel, PDA 20-30%, EF 60%. She was seen by Tereso NewcomerScott Weaver, PA-C April 2013 and had c/o jaw pain, LE edema and dyspnea. Stress myoview 10/29/11 with fixed defect inferior wall c/w scar, no ischemia. Edema resolved with Lasix. I saw her 12/06/14 and she c/o one episode of severe chest pain. Her chest wall was painful to touch. She also noted palpitations occurring several days per week. She is still smoking 1/2 ppd. She has been off of ASA with recent issues with kidney stones. I arranged a stress myoview which showed an inferior fixed defect but no large areas of ischemia. Monitor with PVCs. Lopressor was started.   She is here today for follow up. She has rare sharp chest pains.  No palpitations since starting Lopressor. No recurrent chest pain. She is having some dyspnea at her baseline.    Primary Care Physician: Blane OharaOX,KIRSTEN, MD    Past Medical History  Diagnosis Date  . Hyperlipidemia   . GERD (gastroesophageal reflux disease)   . H/O hiatal hernia   . History of MI (myocardial infarction)     1995 S/P  BMS to RCA  . Right ureteral stone   . History of kidney stones   . Coronary atherosclerosis of native coronary vessel cardiologist-  dr Clifton Jamesmcalhany    hx  MI 1995--  BMS X1 to RCA   . S/p bare metal coronary artery stent     1995 to  RCA  . COPD with emphysema (HCC)   . Dyspnea on exertion   . Smokers' cough (HCC)   . Productive cough   . RLS (restless legs syndrome)   . Arthritis   . Wears dentures     UPPER  . Hematuria   . Chronic kidney disease   .  Renal stone 04/2014    Past Surgical History  Procedure Laterality Date  . Extracorporeal shock wave lithotripsy Right 02-18-2014  . Cardiovascular [facility]  [facility]2011  [facility]    abnormal nuclear study with fixed moderate basal inferolateral wall defect suggestive of prior MI / mild hypokinesis/  no ischemia/  ef 63%  . Coronary angioplasty with stent placement  1995       BMS X1  to mRCA  . Cardiac catheterization  03-11-2006  dr Eden Emmsnishan    LAD  20-30%/  mCFX  20%/   RCA  with widely patent stent,  PDA  20-30%/  EF 60%  . Thyroidectomy, partial  age 70 (approx)    for goiter , benign  . Total abdominal hysterectomy w/ bilateral salpingoophorectomy  age 89's  . Cholecystectomy  yrs ago  . Cystoscopy with ureteroscopy and stent placement Right 03/08/2014    Procedure: RIGHT URETEROSCOPY AND STENT PLACEMENT WITH DILATION OF INFANDIBULAR STENOSIS;  Surgeon: Garnett FarmMark C Ottelin, MD;  [facility];  Service: Urology;  Laterality: Right;  . Holmium laser application Right 03/08/2014    Procedure: HOLMIUM LASER LITHO;  Surgeon: Garnett FarmMark C Ottelin, MD;  [facility]: [facility];  Service: Urology;  Laterality: Right;    Current Outpatient Prescriptions  Medication Sig Dispense Refill  . atorvastatin (LIPITOR) 80 MG tablet TAKE 1 TABLET BY MOUTH EVERY DAY 30 tablet 11  . budesonide-formoterol (SYMBICORT) 80-4.5 MCG/ACT inhaler Inhale 2 puffs into the lungs 2 (two) times daily.    Marland Kitchen gabapentin (NEURONTIN) 400 MG capsule Take 400 mg by mouth 3 (three) times daily.     . isosorbide mononitrate (IMDUR) 120 MG 24 hr tablet TAKE 1 TABLET BY MOUTH EVERY DAY 30 tablet 11  . metoprolol tartrate (LOPRESSOR) 25 MG tablet TAKE 1 TABLET BY MOUTH TWICE A DAY 60 tablet 4  . nitroGLYCERIN (NITROSTAT) 0.4 MG SL tablet Place 1 tablet (0.4 mg total) under the tongue every 5 (five) minutes as needed for chest pain. 25 tablet 6  . omeprazole (PRILOSEC) 40 MG capsule Take 40 mg by  mouth daily.  3  . ondansetron (ZOFRAN) 4 MG tablet Take 4 mg by mouth every 6 (six) hours as needed for nausea or vomiting.    Marland Kitchen OVER THE COUNTER MEDICATION Take 1 capsule by mouth daily.    Marland Kitchen oxyCODONE-acetaminophen (PERCOCET/ROXICET) 5-325 MG per tablet Take 0.5-2 tablets by mouth every 4 (four) hours as needed for severe pain.    . Vitamin D, Ergocalciferol, (DRISDOL) 50000 UNITS CAPS capsule ONE CAPSULE BY MOUTH TWICE A WEEK  2   No current facility-administered medications for this visit.    Allergies  Allergen Reactions  . Contrast Media [Iodinated Diagnostic Agents] Shortness Of Breath    Social History   Social History  . Marital Status: Married    Spouse Name: N/A  . Number of Children: 1  . Years of Education: N/A   Occupational History  . DISABILITY    Social History Main Topics  . Smoking status: Former Smoker -- 0.50 packs/day for 56 years    Types: Cigarettes  . Smokeless tobacco: Never Used     Comment: pt using vapor cig  . Alcohol Use: No  . Drug Use: No  . Sexual Activity: Not on file   Other Topics Concern  . Not on file   Social History Narrative    Family History  Problem Relation Age of Onset  . Heart attack Father   . Heart attack Mother   . Thyroid disease Sister   . Kidney disease Brother   . Breast cancer Sister   . Crohn's disease Sister     Review of Systems:  As stated in the HPI and otherwise negative.   BP 110/62 mmHg  Pulse 59  Ht  (1.702 m)  Wt 140 lb 1.9 oz (63.558 kg)  BMI 21.94 kg/m2  SpO2 95%  Physical Examination: General: Well developed, well nourished, NAD HEENT: OP clear, mucus membranes moist SKIN: warm, dry. No rashes. Neuro: No focal deficits Musculoskeletal: Muscle strength 5/5 all ext Psychiatric: Mood and affect normal Neck: No JVD, no carotid bruits, no thyromegaly, no lymphadenopathy. Lungs:Clear bilaterally, no wheezes, rhonci, crackles Cardiovascular: Regular rate and rhythm. No murmurs,  gallops or rubs. Abdomen:Soft. Bowel sounds present. Non-tender.  Extremities: No lower extremity edema. Pulses are 2 + in the bilateral DP/PT.  Stress myoview 12/27/14:  There is a large, severe, fixed defect involving the entire inferolateral, anterolateral, apical lateral and inferoapical walls. There is a small in size, mild fixed defect involving the basal and apical anteroseptal walls. There is no ischemia noted. This is an intermediate risk study.A prior study was conducted  on 10/25/2011.  LV cavity size is normal. The left ventricular ejection fraction is mildly decreased (45-54%)  A prior study was conducted on 10/25/2011.Compared to the prior study, there are changes.The anterolateral fixed defect is new   EKG:  EKG is ordered today. The ekg ordered today demonstrates sinus brady, rate 59 bpm. Non-specific ST abnormality.   Recent Labs: No results found for requested labs within last 365 days.   Lipid Panel Followed in primary care   Wt Readings from Last 3 Encounters:  09/19/15 140 lb 1.9 oz (63.558 kg)  01/07/15 128 lb (58.06 kg)  12/27/14 125 lb (56.7 kg)     Other studies Reviewed: Additional studies/ records that were reviewed today include: . Review of the above records demonstrates:    Assessment and Plan:   1. CAD/stable angina: She has known CAD with remote stenting of the RCA. Last cath in 2007 with patent RCA stent and minimal plaque in the LAD and Circumflex. Her chest pain seems to be more musculoskeletal but she is a poor historian. Stress myoview June 2016 without ischemia.  Continue current medical therapy.   2. Hyperlipidemia: Continue statin. Lipids well controlled.   3. HTN: BP controlled today. No changes.   4. Palpitations/PVCs: Monitor with PVCs. Palpitations improved on Lopressor.   5. Tobacco abuse: Smoking cessation is advised. She does not wish to stop smoking.   Current medicines are reviewed at length with the patient today.  The patient  does not have concerns regarding medicines.  The following changes have been made:  no change  Labs/ tests ordered today include:   Orders Placed This Encounter  Procedures  . EKG 12-Lead    Disposition:   FU with me in 6 months  Signed, Verne Carrow, MD 09/19/2015 7:38 PM    [facility] [address] [place], [place], [place]  [address] Phone: [phone]; Fax: [phone]

## 2015-09-30 DIAGNOSIS — J441 Chronic obstructive pulmonary disease with (acute) exacerbation: Secondary | ICD-10-CM | POA: Diagnosis not present

## 2015-11-04 DIAGNOSIS — R238 Other skin changes: Secondary | ICD-10-CM | POA: Diagnosis not present

## 2015-11-04 DIAGNOSIS — J410 Simple chronic bronchitis: Secondary | ICD-10-CM | POA: Diagnosis not present

## 2015-11-04 DIAGNOSIS — R233 Spontaneous ecchymoses: Secondary | ICD-10-CM | POA: Diagnosis not present

## 2015-11-16 DIAGNOSIS — J441 Chronic obstructive pulmonary disease with (acute) exacerbation: Secondary | ICD-10-CM | POA: Diagnosis not present

## 2015-11-16 DIAGNOSIS — R7303 Prediabetes: Secondary | ICD-10-CM | POA: Diagnosis not present

## 2015-11-16 DIAGNOSIS — R233 Spontaneous ecchymoses: Secondary | ICD-10-CM | POA: Diagnosis not present

## 2015-11-16 DIAGNOSIS — I2510 Atherosclerotic heart disease of native coronary artery without angina pectoris: Secondary | ICD-10-CM | POA: Diagnosis not present

## 2015-11-16 DIAGNOSIS — E119 Type 2 diabetes mellitus without complications: Secondary | ICD-10-CM | POA: Diagnosis not present

## 2015-11-16 DIAGNOSIS — R238 Other skin changes: Secondary | ICD-10-CM | POA: Diagnosis not present

## 2015-11-16 DIAGNOSIS — R531 Weakness: Secondary | ICD-10-CM | POA: Diagnosis not present

## 2015-11-16 DIAGNOSIS — R7301 Impaired fasting glucose: Secondary | ICD-10-CM | POA: Diagnosis not present

## 2015-11-16 DIAGNOSIS — E782 Mixed hyperlipidemia: Secondary | ICD-10-CM | POA: Diagnosis not present

## 2015-11-17 DIAGNOSIS — E119 Type 2 diabetes mellitus without complications: Secondary | ICD-10-CM | POA: Diagnosis not present

## 2015-11-17 DIAGNOSIS — E782 Mixed hyperlipidemia: Secondary | ICD-10-CM | POA: Diagnosis not present

## 2015-11-17 DIAGNOSIS — R946 Abnormal results of thyroid function studies: Secondary | ICD-10-CM | POA: Diagnosis not present

## 2015-12-05 ENCOUNTER — Encounter: Payer: Self-pay | Admitting: Cardiovascular Disease

## 2015-12-05 DIAGNOSIS — R0602 Shortness of breath: Secondary | ICD-10-CM | POA: Diagnosis not present

## 2015-12-05 DIAGNOSIS — J441 Chronic obstructive pulmonary disease with (acute) exacerbation: Secondary | ICD-10-CM | POA: Diagnosis not present

## 2015-12-05 DIAGNOSIS — J180 Bronchopneumonia, unspecified organism: Secondary | ICD-10-CM | POA: Diagnosis not present

## 2015-12-05 DIAGNOSIS — J449 Chronic obstructive pulmonary disease, unspecified: Secondary | ICD-10-CM | POA: Diagnosis not present

## 2015-12-05 DIAGNOSIS — Z9861 Coronary angioplasty status: Secondary | ICD-10-CM | POA: Diagnosis not present

## 2015-12-05 DIAGNOSIS — I252 Old myocardial infarction: Secondary | ICD-10-CM | POA: Diagnosis not present

## 2015-12-05 DIAGNOSIS — F17210 Nicotine dependence, cigarettes, uncomplicated: Secondary | ICD-10-CM | POA: Diagnosis not present

## 2015-12-05 DIAGNOSIS — I2510 Atherosclerotic heart disease of native coronary artery without angina pectoris: Secondary | ICD-10-CM | POA: Diagnosis not present

## 2015-12-05 DIAGNOSIS — E119 Type 2 diabetes mellitus without complications: Secondary | ICD-10-CM | POA: Diagnosis not present

## 2015-12-05 DIAGNOSIS — N3001 Acute cystitis with hematuria: Secondary | ICD-10-CM | POA: Diagnosis not present

## 2015-12-05 DIAGNOSIS — I10 Essential (primary) hypertension: Secondary | ICD-10-CM | POA: Diagnosis not present

## 2015-12-05 DIAGNOSIS — E872 Acidosis: Secondary | ICD-10-CM | POA: Diagnosis not present

## 2015-12-09 ENCOUNTER — Encounter: Payer: Self-pay | Admitting: Cardiovascular Disease

## 2015-12-14 DIAGNOSIS — R0602 Shortness of breath: Secondary | ICD-10-CM | POA: Diagnosis not present

## 2015-12-14 DIAGNOSIS — J441 Chronic obstructive pulmonary disease with (acute) exacerbation: Secondary | ICD-10-CM | POA: Diagnosis not present

## 2015-12-14 DIAGNOSIS — R600 Localized edema: Secondary | ICD-10-CM | POA: Diagnosis not present

## 2015-12-14 DIAGNOSIS — G308 Other Alzheimer's disease: Secondary | ICD-10-CM | POA: Diagnosis not present

## 2015-12-14 DIAGNOSIS — R0902 Hypoxemia: Secondary | ICD-10-CM | POA: Diagnosis not present

## 2015-12-14 DIAGNOSIS — E119 Type 2 diabetes mellitus without complications: Secondary | ICD-10-CM | POA: Diagnosis not present

## 2015-12-14 DIAGNOSIS — R0789 Other chest pain: Secondary | ICD-10-CM | POA: Diagnosis not present

## 2015-12-14 DIAGNOSIS — K644 Residual hemorrhoidal skin tags: Secondary | ICD-10-CM | POA: Diagnosis not present

## 2015-12-14 DIAGNOSIS — N3001 Acute cystitis with hematuria: Secondary | ICD-10-CM | POA: Diagnosis not present

## 2015-12-15 ENCOUNTER — Telehealth: Payer: Self-pay | Admitting: Cardiovascular Disease

## 2015-12-15 NOTE — Telephone Encounter (Signed)
New Message  Pt granddtr calling to speak w/ RN concerning pt's recent hospital stay- appt made for 6/13. Please call back and discuss.

## 2015-12-15 NOTE — Telephone Encounter (Signed)
Spoke with pt's grand daughter. She reports pt was recently hospitalized [facility] for shortness of breath, chest pain and palpitations. Sent home on oxygen.  Grand daughter reports pt also has swelling in legs.  Saw primary care yesterday and was told to schedule appt with cardiology.  Appt made for pt to see Dr. Clifton JamesMcAlhany on June 5,2017 at 9:30.

## 2015-12-17 DIAGNOSIS — M1991 Primary osteoarthritis, unspecified site: Secondary | ICD-10-CM | POA: Diagnosis not present

## 2015-12-17 DIAGNOSIS — F0390 Unspecified dementia without behavioral disturbance: Secondary | ICD-10-CM | POA: Diagnosis not present

## 2015-12-17 DIAGNOSIS — I2510 Atherosclerotic heart disease of native coronary artery without angina pectoris: Secondary | ICD-10-CM | POA: Diagnosis not present

## 2015-12-17 DIAGNOSIS — J441 Chronic obstructive pulmonary disease with (acute) exacerbation: Secondary | ICD-10-CM | POA: Diagnosis not present

## 2015-12-17 DIAGNOSIS — I10 Essential (primary) hypertension: Secondary | ICD-10-CM | POA: Diagnosis not present

## 2015-12-17 DIAGNOSIS — E119 Type 2 diabetes mellitus without complications: Secondary | ICD-10-CM | POA: Diagnosis not present

## 2015-12-18 ENCOUNTER — Other Ambulatory Visit: Payer: Self-pay | Admitting: Cardiovascular Disease

## 2015-12-18 NOTE — Progress Notes (Signed)
Chief Complaint  Patient presents with  . Shortness of Breath   History of Present Illness: 70 yo female with history of CAD, HLD, COPD, GERD here today for cardiac follow up. She has been followed in the past by Dr. Daleen SquibbWall. She has a history of CAD, status post bare metal stent RCA in 1995. Last cardiac cath 02/2006: LAD 20-30%, circumflex 20% mid, RCA widely patent stent in the mid vessel, PDA 20-30%, EF 60%. She was seen by Tereso NewcomerScott Weaver, PA-C April 2013 and had c/o jaw pain, LE edema and dyspnea. Stress myoview 10/29/11 with fixed defect inferior wall c/w scar, no ischemia. Edema resolved with Lasix. I saw her 12/06/14 and she c/o one episode of severe chest pain. Her chest wall was painful to touch. She also noted palpitations occurring several days per week. She is still smoking 1/2 ppd. She has been off of ASA with recent issues with kidney stones. I arranged a stress myoview which showed an inferior fixed defect but no large areas of ischemia. Monitor with PVCs. Lopressor was started. Ad[facility] to [facility] 2017 with SOB, chest pain, LE edema and felt to have a COPD exacerbation and UTI. She was treated with antibiotics and steroids. She was seen in primary care last week and Lasix was added 20 mg daily for swelling. She has had good response to the Lasix but is now dizzy. She remains dyspneic. She is still smoking. She is now wearing oxygen 24/7 except when smoking. She has no chest pain. She has occasional palpitations.   Primary Care Physician: Blane OharaOX,KIRSTEN, MD   Past Medical History  Diagnosis Date  . Hyperlipidemia   . GERD (gastroesophageal reflux disease)   . H/O hiatal hernia   . History of MI (myocardial infarction)     1995 S/P  BMS to RCA  . Right ureteral stone   . History of kidney stones   . Coronary atherosclerosis of native coronary vessel cardiologist-  dr Clifton Jamesmcalhany    hx  MI 1995--  BMS X1 to RCA   . S/p bare metal coronary artery stent     1995 to  RCA  . COPD  with emphysema (HCC)   . Dyspnea on exertion   . Smokers' cough (HCC)   . Productive cough   . RLS (restless legs syndrome)   . Arthritis   . Wears dentures     UPPER  . Hematuria   . Chronic kidney disease   . Renal stone 04/2014    Past Surgical History  Procedure Laterality Date  . Extracorporeal shock wave lithotripsy Right 02-18-2014  . Cardiovascular stress [facility]  [facility]    abnormal nuclear study with fixed moderate basal inferolateral wall defect suggestive of prior MI / mild hypokinesis/  no ischemia/  ef 63%  . Coronary angioplasty with stent placement  1995       BMS X1  to mRCA  . Cardiac catheterization  03-11-2006  dr Eden Emmsnishan    LAD  20-30%/  mCFX  20%/   RCA  with widely patent stent,  PDA  20-30%/  EF 60%  . Thyroidectomy, partial  age 70 (approx)    for goiter , benign  . Total abdominal hysterectomy w/ bilateral salpingoophorectomy  age 70's  . Cholecystectomy  yrs ago  . Cystoscopy with ureteroscopy and stent placement Right 03/08/2014    Procedure: RIGHT URETEROSCOPY AND STENT PLACEMENT WITH DILATION OF INFANDIBULAR STENOSIS;  Surgeon: Garnett FarmMark C Ottelin, MD;  Location:   SURGERY CENTER;  Service: Urology;  Laterality: Right;  . Holmium laser application Right 03/08/2014    Procedure: HOLMIUM LASER LITHO;  Surgeon: Garnett Farm, MD;  Location: [facility];  Service: Urology;  Laterality: Right;    Current Outpatient Prescriptions  Medication Sig Dispense Refill  . albuterol (PROVENTIL) (2.5 MG/3ML) 0.083% nebulizer solution Take 2.5 mg by nebulization 4 (four) times daily.     Marland Kitchen atorvastatin (LIPITOR) 80 MG tablet TAKE 1 TABLET BY MOUTH EVERY DAY 30 tablet 11  . budesonide-formoterol (SYMBICORT) 80-4.5 MCG/ACT inhaler Inhale 2 puffs into the lungs 2 (two) times daily.    Tery Sanfilippo Calcium (STOOL SOFTENER PO) Take 1 tablet by mouth daily.    Marland Kitchen gabapentin (NEURONTIN) 400 MG capsule Take 400 mg by mouth 3 (three) times daily.      . isosorbide mononitrate (IMDUR) 120 MG 24 hr tablet TAKE 1 TABLET BY MOUTH EVERY DAY 30 tablet 11  . metFORMIN (GLUCOPHAGE) 500 MG tablet TAKE 1 DAILY IN THE MORNING  0  . methimazole (TAPAZOLE) 5 MG tablet Take 5 mg by mouth 2 (two) times daily.  0  . metoprolol tartrate (LOPRESSOR) 25 MG tablet TAKE 1 TABLET BY MOUTH TWICE A DAY 60 tablet 4  . nitroGLYCERIN (NITROSTAT) 0.4 MG SL tablet Place 0.4 mg under the tongue every 5 (five) minutes as needed for chest pain (X3 DOSES BEFORE CALLING 911).    . NON FORMULARY Place into the nose daily. OXYGEN ALL DAY    . omeprazole (PRILOSEC) 40 MG capsule Take 40 mg by mouth daily.  3  . ondansetron (ZOFRAN) 4 MG tablet Take 4 mg by mouth every 6 (six) hours as needed for nausea or vomiting.    Marland Kitchen oxyCODONE-acetaminophen (PERCOCET/ROXICET) 5-325 MG per tablet Take 0.5-2 tablets by mouth every 4 (four) hours as needed for severe pain.    . VENTOLIN HFA 108 (90 Base) MCG/ACT inhaler     . furosemide (LASIX) 20 MG tablet Take 1 tablet (20 mg total) by mouth daily as needed. 90 tablet 3   No current facility-administered medications for this visit.    Allergies  Allergen Reactions  . Contrast Media [Iodinated Diagnostic Agents] Shortness Of Breath    Social History   Social History  . Marital Status: Married    Spouse Name: N/A  . Number of Children: 1  . Years of Education: N/A   Occupational History  . DISABILITY    Social History Main Topics  . Smoking status: Former Smoker -- 0.50 packs/day for 56 years    Types: Cigarettes  . Smokeless tobacco: Never Used     Comment: pt using vapor cig  . Alcohol Use: No  . Drug Use: No  . Sexual Activity: Not on file   Other Topics Concern  . Not on file   Social History Narrative    Family History  Problem Relation Age of Onset  . Heart attack Father   . Heart attack Mother   . Thyroid disease Sister   . Kidney disease Brother   . Breast cancer Sister   . Crohn's disease Sister      Review of Systems:  As stated in the HPI and otherwise negative.   BP 100/60 mmHg  Pulse 92  Ht 5\' 7"  (1.702 m)  Wt 135 lb 12.8 oz (61.598 kg)  BMI 21.26 kg/m2  SpO2 93%  Physical Examination: General: Well developed, well nourished, NAD HEENT: OP clear, mucus membranes moist  SKIN: warm, dry. No rashes. Neuro: No focal deficits Musculoskeletal: Muscle strength 5/5 all ext Psychiatric: Mood and affect normal Neck: No JVD, no carotid bruits, no thyromegaly, no lymphadenopathy. Lungs:Clear bilaterally, no wheezes, rhonci, crackles Cardiovascular: Regular rate and rhythm. No murmurs, gallops or rubs. Abdomen:Soft. Bowel sounds present. Non-tender.  Extremities: No lower extremity edema. Pulses are 2 + in the bilateral DP/PT.  Stress myoview 12/27/14:  There is a large, severe, fixed defect involving the entire inferolateral, anterolateral, apical lateral and inferoapical walls. There is a small in size, mild fixed defect involving the basal and apical anteroseptal walls. There is no ischemia noted. This is an intermediate risk study.A prior study was conducted on 10/25/2011.  LV cavity size is normal. The left ventricular ejection fraction is mildly decreased (45-54%)  A prior study was conducted on 10/25/2011.Compared to the prior study, there are changes.The anterolateral fixed defect is new   EKG:  EKG is not ordered today. The ekg ordered today demonstrates   Recent Labs: No results found for requested labs within last 365 days.   Lipid Panel Followed in primary care   Wt Readings from Last 3 Encounters:  12/19/15 135 lb 12.8 oz (61.598 kg)  09/19/15 140 lb 1.9 oz (63.558 kg)  01/07/15 128 lb (58.06 kg)     Other studies Reviewed: Additional studies/ records that were reviewed today include: . Review of the above records demonstrates:    Assessment and Plan:   1. CAD: She has known CAD with remote stenting of the RCA. Last cath in 2007 with patent RCA stent and  minimal plaque in the LAD and Circumflex. Stress myoview June 2016 without ischemia. She has no chest pain to suggest angina. I think her respiratory issues are mostly pulmonary related. She has COPD and is on home oxygen, still smoking. She is asking me today to make a referral to a Pulmonary specialist. Will check echo to assess LV function.   2. Hyperlipidemia: Continue statin. Lipids well controlled per pt in primary care.   3. HTN: BP controlled today. No changes.   4. Palpitations/PVCs: Palpitations improved on Lopressor.   5. Tobacco abuse: Smoking cessation is advised. She does not wish to stop smoking.   6. COPD: See above. Recent admission at [facility] for COPD exacerbation. She is now on home O2. No improvement in dyspnea with lasix. Still smoking. I suspect that her symptoms are mostly related to her underlying lung disease. Will use Lasix prn for weight gain and swelling. Echo to assess LV function. Referral to pulmonary.   Current medicines are reviewed at length with the patient today.  The patient does not have concerns regarding medicines.  The following changes have been made:  no change  Labs/ tests ordered today include:   Orders Placed This Encounter  Procedures  . Ambulatory referral to Pulmonology  . ECHOCARDIOGRAM COMPLETE    Disposition:   FU with me in 6 months  Signed, Verne Carrow, MD 12/19/2015 10:25 AM    [facility] [address] [place], [place], [place]  [address] Phone: [phone]; Fax: [phone]

## 2015-12-19 ENCOUNTER — Encounter: Payer: Self-pay | Admitting: Cardiovascular Disease

## 2015-12-19 ENCOUNTER — Ambulatory Visit (INDEPENDENT_AMBULATORY_CARE_PROVIDER_SITE_OTHER): Payer: Commercial Managed Care - HMO | Admitting: Cardiovascular Disease

## 2015-12-19 VITALS — BP 100/60 | HR 92 | Ht 67.0 in | Wt 135.8 lb

## 2015-12-19 DIAGNOSIS — I10 Essential (primary) hypertension: Secondary | ICD-10-CM

## 2015-12-19 DIAGNOSIS — R000 Tachycardia, unspecified: Secondary | ICD-10-CM | POA: Diagnosis not present

## 2015-12-19 DIAGNOSIS — Z7189 Other specified counseling: Secondary | ICD-10-CM | POA: Diagnosis not present

## 2015-12-19 DIAGNOSIS — L89152 Pressure ulcer of sacral region, stage 2: Secondary | ICD-10-CM | POA: Diagnosis not present

## 2015-12-19 DIAGNOSIS — E118 Type 2 diabetes mellitus with unspecified complications: Secondary | ICD-10-CM | POA: Diagnosis not present

## 2015-12-19 DIAGNOSIS — M1991 Primary osteoarthritis, unspecified site: Secondary | ICD-10-CM | POA: Diagnosis not present

## 2015-12-19 DIAGNOSIS — R0600 Dyspnea, unspecified: Secondary | ICD-10-CM | POA: Diagnosis not present

## 2015-12-19 DIAGNOSIS — R531 Weakness: Secondary | ICD-10-CM | POA: Diagnosis not present

## 2015-12-19 DIAGNOSIS — Z720 Tobacco use: Secondary | ICD-10-CM | POA: Diagnosis not present

## 2015-12-19 DIAGNOSIS — J069 Acute upper respiratory infection, unspecified: Secondary | ICD-10-CM | POA: Diagnosis not present

## 2015-12-19 DIAGNOSIS — Z515 Encounter for palliative care: Secondary | ICD-10-CM | POA: Diagnosis not present

## 2015-12-19 DIAGNOSIS — Z9981 Dependence on supplemental oxygen: Secondary | ICD-10-CM | POA: Diagnosis not present

## 2015-12-19 DIAGNOSIS — I2510 Atherosclerotic heart disease of native coronary artery without angina pectoris: Secondary | ICD-10-CM | POA: Diagnosis not present

## 2015-12-19 DIAGNOSIS — J441 Chronic obstructive pulmonary disease with (acute) exacerbation: Secondary | ICD-10-CM | POA: Diagnosis not present

## 2015-12-19 DIAGNOSIS — R002 Palpitations: Secondary | ICD-10-CM | POA: Diagnosis not present

## 2015-12-19 DIAGNOSIS — M6281 Muscle weakness (generalized): Secondary | ICD-10-CM | POA: Diagnosis not present

## 2015-12-19 DIAGNOSIS — Z66 Do not resuscitate: Secondary | ICD-10-CM | POA: Diagnosis not present

## 2015-12-19 DIAGNOSIS — J9611 Chronic respiratory failure with hypoxia: Secondary | ICD-10-CM | POA: Diagnosis not present

## 2015-12-19 DIAGNOSIS — I519 Heart disease, unspecified: Secondary | ICD-10-CM | POA: Diagnosis not present

## 2015-12-19 DIAGNOSIS — K5900 Constipation, unspecified: Secondary | ICD-10-CM | POA: Diagnosis not present

## 2015-12-19 DIAGNOSIS — N179 Acute kidney failure, unspecified: Secondary | ICD-10-CM | POA: Diagnosis not present

## 2015-12-19 DIAGNOSIS — E785 Hyperlipidemia, unspecified: Secondary | ICD-10-CM

## 2015-12-19 DIAGNOSIS — N390 Urinary tract infection, site not specified: Secondary | ICD-10-CM | POA: Diagnosis not present

## 2015-12-19 DIAGNOSIS — F0390 Unspecified dementia without behavioral disturbance: Secondary | ICD-10-CM | POA: Diagnosis not present

## 2015-12-19 DIAGNOSIS — J449 Chronic obstructive pulmonary disease, unspecified: Secondary | ICD-10-CM | POA: Diagnosis not present

## 2015-12-19 DIAGNOSIS — E119 Type 2 diabetes mellitus without complications: Secondary | ICD-10-CM | POA: Diagnosis not present

## 2015-12-19 MED ORDER — FUROSEMIDE 20 MG PO TABS: 20.0000 mg | ORAL_TABLET | Freq: Every day | ORAL | Status: AC | PRN

## 2015-12-19 NOTE — Patient Instructions (Signed)
Medication Instructions:  Your physician has recommended you make the following change in your medication: Change furosemide to every day as needed.     Labwork: none  Testing/Procedures: Your physician has requested that you have an echocardiogram. Echocardiography is a painless test that uses sound waves to create images of your heart. It provides your doctor with information about the size and shape of your heart and how well your heart's chambers and valves are working. This procedure takes approximately one hour. There are no restrictions for this procedure.  [facility].   Follow-Up:  Your physician recommends that you schedule a follow-up appointment in: 3 months.     Any Other Special Instructions Will Be Listed Below (If Applicable).  Weigh yourself daily.  Call us if weight starts to increase.    If you need a refill on your cardiac medications before your next appointment, please call your pharmacy.

## 2015-12-20 DIAGNOSIS — R0600 Dyspnea, unspecified: Secondary | ICD-10-CM | POA: Diagnosis not present

## 2015-12-20 DIAGNOSIS — I519 Heart disease, unspecified: Secondary | ICD-10-CM | POA: Diagnosis not present

## 2015-12-23 DIAGNOSIS — M1991 Primary osteoarthritis, unspecified site: Secondary | ICD-10-CM | POA: Diagnosis not present

## 2015-12-23 DIAGNOSIS — I2510 Atherosclerotic heart disease of native coronary artery without angina pectoris: Secondary | ICD-10-CM | POA: Diagnosis not present

## 2015-12-23 DIAGNOSIS — F0390 Unspecified dementia without behavioral disturbance: Secondary | ICD-10-CM | POA: Diagnosis not present

## 2015-12-23 DIAGNOSIS — I10 Essential (primary) hypertension: Secondary | ICD-10-CM | POA: Diagnosis not present

## 2015-12-23 DIAGNOSIS — E119 Type 2 diabetes mellitus without complications: Secondary | ICD-10-CM | POA: Diagnosis not present

## 2015-12-23 DIAGNOSIS — J441 Chronic obstructive pulmonary disease with (acute) exacerbation: Secondary | ICD-10-CM | POA: Diagnosis not present

## 2015-12-26 DIAGNOSIS — I10 Essential (primary) hypertension: Secondary | ICD-10-CM | POA: Diagnosis not present

## 2015-12-26 DIAGNOSIS — E119 Type 2 diabetes mellitus without complications: Secondary | ICD-10-CM | POA: Diagnosis not present

## 2015-12-26 DIAGNOSIS — J441 Chronic obstructive pulmonary disease with (acute) exacerbation: Secondary | ICD-10-CM | POA: Diagnosis not present

## 2015-12-26 DIAGNOSIS — M1991 Primary osteoarthritis, unspecified site: Secondary | ICD-10-CM | POA: Diagnosis not present

## 2015-12-26 DIAGNOSIS — F0390 Unspecified dementia without behavioral disturbance: Secondary | ICD-10-CM | POA: Diagnosis not present

## 2015-12-26 DIAGNOSIS — I2510 Atherosclerotic heart disease of native coronary artery without angina pectoris: Secondary | ICD-10-CM | POA: Diagnosis not present

## 2015-12-27 ENCOUNTER — Ambulatory Visit: Payer: Commercial Managed Care - HMO | Admitting: Physician Assistant

## 2015-12-27 DIAGNOSIS — I10 Essential (primary) hypertension: Secondary | ICD-10-CM | POA: Diagnosis not present

## 2015-12-27 DIAGNOSIS — M1991 Primary osteoarthritis, unspecified site: Secondary | ICD-10-CM | POA: Diagnosis not present

## 2015-12-27 DIAGNOSIS — I2510 Atherosclerotic heart disease of native coronary artery without angina pectoris: Secondary | ICD-10-CM | POA: Diagnosis not present

## 2015-12-27 DIAGNOSIS — N390 Urinary tract infection, site not specified: Secondary | ICD-10-CM | POA: Diagnosis not present

## 2015-12-27 DIAGNOSIS — F0390 Unspecified dementia without behavioral disturbance: Secondary | ICD-10-CM | POA: Diagnosis not present

## 2015-12-27 DIAGNOSIS — J441 Chronic obstructive pulmonary disease with (acute) exacerbation: Secondary | ICD-10-CM | POA: Diagnosis not present

## 2015-12-27 DIAGNOSIS — E119 Type 2 diabetes mellitus without complications: Secondary | ICD-10-CM | POA: Diagnosis not present

## 2015-12-28 DIAGNOSIS — M1991 Primary osteoarthritis, unspecified site: Secondary | ICD-10-CM | POA: Diagnosis not present

## 2015-12-28 DIAGNOSIS — E119 Type 2 diabetes mellitus without complications: Secondary | ICD-10-CM | POA: Diagnosis not present

## 2015-12-28 DIAGNOSIS — I2510 Atherosclerotic heart disease of native coronary artery without angina pectoris: Secondary | ICD-10-CM | POA: Diagnosis not present

## 2015-12-28 DIAGNOSIS — F0390 Unspecified dementia without behavioral disturbance: Secondary | ICD-10-CM | POA: Diagnosis not present

## 2015-12-28 DIAGNOSIS — I10 Essential (primary) hypertension: Secondary | ICD-10-CM | POA: Diagnosis not present

## 2015-12-28 DIAGNOSIS — J441 Chronic obstructive pulmonary disease with (acute) exacerbation: Secondary | ICD-10-CM | POA: Diagnosis not present

## 2015-12-29 DIAGNOSIS — R269 Unspecified abnormalities of gait and mobility: Secondary | ICD-10-CM | POA: Diagnosis not present

## 2015-12-30 DIAGNOSIS — B372 Candidiasis of skin and nail: Secondary | ICD-10-CM | POA: Diagnosis not present

## 2016-01-01 DIAGNOSIS — M47816 Spondylosis without myelopathy or radiculopathy, lumbar region: Secondary | ICD-10-CM | POA: Diagnosis not present

## 2016-01-01 DIAGNOSIS — N200 Calculus of kidney: Secondary | ICD-10-CM | POA: Diagnosis not present

## 2016-01-01 DIAGNOSIS — Z87891 Personal history of nicotine dependence: Secondary | ICD-10-CM | POA: Diagnosis not present

## 2016-01-01 DIAGNOSIS — M5126 Other intervertebral disc displacement, lumbar region: Secondary | ICD-10-CM | POA: Diagnosis not present

## 2016-01-01 DIAGNOSIS — R1084 Generalized abdominal pain: Secondary | ICD-10-CM | POA: Diagnosis not present

## 2016-01-01 DIAGNOSIS — M5116 Intervertebral disc disorders with radiculopathy, lumbar region: Secondary | ICD-10-CM | POA: Diagnosis not present

## 2016-01-01 DIAGNOSIS — M5117 Intervertebral disc disorders with radiculopathy, lumbosacral region: Secondary | ICD-10-CM | POA: Diagnosis not present

## 2016-01-01 DIAGNOSIS — Z79899 Other long term (current) drug therapy: Secondary | ICD-10-CM | POA: Diagnosis not present

## 2016-01-01 DIAGNOSIS — I2510 Atherosclerotic heart disease of native coronary artery without angina pectoris: Secondary | ICD-10-CM | POA: Diagnosis not present

## 2016-01-01 DIAGNOSIS — M5442 Lumbago with sciatica, left side: Secondary | ICD-10-CM | POA: Diagnosis not present

## 2016-01-01 DIAGNOSIS — E119 Type 2 diabetes mellitus without complications: Secondary | ICD-10-CM | POA: Diagnosis not present

## 2016-01-01 DIAGNOSIS — B379 Candidiasis, unspecified: Secondary | ICD-10-CM | POA: Diagnosis not present

## 2016-01-01 DIAGNOSIS — J449 Chronic obstructive pulmonary disease, unspecified: Secondary | ICD-10-CM | POA: Diagnosis not present

## 2016-01-01 DIAGNOSIS — Z7984 Long term (current) use of oral hypoglycemic drugs: Secondary | ICD-10-CM | POA: Diagnosis not present

## 2016-01-01 DIAGNOSIS — L304 Erythema intertrigo: Secondary | ICD-10-CM | POA: Diagnosis not present

## 2016-01-02 DIAGNOSIS — I10 Essential (primary) hypertension: Secondary | ICD-10-CM | POA: Diagnosis not present

## 2016-01-02 DIAGNOSIS — J441 Chronic obstructive pulmonary disease with (acute) exacerbation: Secondary | ICD-10-CM | POA: Diagnosis not present

## 2016-01-02 DIAGNOSIS — E119 Type 2 diabetes mellitus without complications: Secondary | ICD-10-CM | POA: Diagnosis not present

## 2016-01-02 DIAGNOSIS — F0390 Unspecified dementia without behavioral disturbance: Secondary | ICD-10-CM | POA: Diagnosis not present

## 2016-01-02 DIAGNOSIS — I2510 Atherosclerotic heart disease of native coronary artery without angina pectoris: Secondary | ICD-10-CM | POA: Diagnosis not present

## 2016-01-02 DIAGNOSIS — M1991 Primary osteoarthritis, unspecified site: Secondary | ICD-10-CM | POA: Diagnosis not present

## 2016-01-03 DIAGNOSIS — A6009 Herpesviral infection of other urogenital tract: Secondary | ICD-10-CM | POA: Diagnosis not present

## 2016-01-03 DIAGNOSIS — J410 Simple chronic bronchitis: Secondary | ICD-10-CM | POA: Diagnosis not present

## 2016-01-03 DIAGNOSIS — M5127 Other intervertebral disc displacement, lumbosacral region: Secondary | ICD-10-CM | POA: Diagnosis not present

## 2016-01-04 DIAGNOSIS — J441 Chronic obstructive pulmonary disease with (acute) exacerbation: Secondary | ICD-10-CM | POA: Diagnosis not present

## 2016-01-04 DIAGNOSIS — M1991 Primary osteoarthritis, unspecified site: Secondary | ICD-10-CM | POA: Diagnosis not present

## 2016-01-04 DIAGNOSIS — I2510 Atherosclerotic heart disease of native coronary artery without angina pectoris: Secondary | ICD-10-CM | POA: Diagnosis not present

## 2016-01-04 DIAGNOSIS — I10 Essential (primary) hypertension: Secondary | ICD-10-CM | POA: Diagnosis not present

## 2016-01-04 DIAGNOSIS — F0390 Unspecified dementia without behavioral disturbance: Secondary | ICD-10-CM | POA: Diagnosis not present

## 2016-01-04 DIAGNOSIS — E119 Type 2 diabetes mellitus without complications: Secondary | ICD-10-CM | POA: Diagnosis not present

## 2016-01-05 ENCOUNTER — Other Ambulatory Visit (HOSPITAL_COMMUNITY): Payer: Commercial Managed Care - HMO

## 2016-01-05 DIAGNOSIS — J441 Chronic obstructive pulmonary disease with (acute) exacerbation: Secondary | ICD-10-CM | POA: Diagnosis not present

## 2016-01-05 DIAGNOSIS — F0390 Unspecified dementia without behavioral disturbance: Secondary | ICD-10-CM | POA: Diagnosis not present

## 2016-01-05 DIAGNOSIS — I2510 Atherosclerotic heart disease of native coronary artery without angina pectoris: Secondary | ICD-10-CM | POA: Diagnosis not present

## 2016-01-05 DIAGNOSIS — I10 Essential (primary) hypertension: Secondary | ICD-10-CM | POA: Diagnosis not present

## 2016-01-05 DIAGNOSIS — E119 Type 2 diabetes mellitus without complications: Secondary | ICD-10-CM | POA: Diagnosis not present

## 2016-01-05 DIAGNOSIS — M1991 Primary osteoarthritis, unspecified site: Secondary | ICD-10-CM | POA: Diagnosis not present

## 2016-01-06 DIAGNOSIS — J441 Chronic obstructive pulmonary disease with (acute) exacerbation: Secondary | ICD-10-CM | POA: Diagnosis not present

## 2016-01-06 DIAGNOSIS — F329 Major depressive disorder, single episode, unspecified: Secondary | ICD-10-CM | POA: Diagnosis not present

## 2016-01-06 DIAGNOSIS — N200 Calculus of kidney: Secondary | ICD-10-CM | POA: Diagnosis not present

## 2016-01-06 DIAGNOSIS — E785 Hyperlipidemia, unspecified: Secondary | ICD-10-CM | POA: Diagnosis not present

## 2016-01-06 DIAGNOSIS — K219 Gastro-esophageal reflux disease without esophagitis: Secondary | ICD-10-CM | POA: Diagnosis not present

## 2016-01-06 DIAGNOSIS — E119 Type 2 diabetes mellitus without complications: Secondary | ICD-10-CM | POA: Diagnosis not present

## 2016-01-06 DIAGNOSIS — I10 Essential (primary) hypertension: Secondary | ICD-10-CM | POA: Diagnosis not present

## 2016-01-06 DIAGNOSIS — R0600 Dyspnea, unspecified: Secondary | ICD-10-CM | POA: Diagnosis not present

## 2016-01-06 DIAGNOSIS — I2510 Atherosclerotic heart disease of native coronary artery without angina pectoris: Secondary | ICD-10-CM | POA: Diagnosis not present

## 2016-01-06 DIAGNOSIS — M810 Age-related osteoporosis without current pathological fracture: Secondary | ICD-10-CM | POA: Diagnosis not present

## 2016-01-06 DIAGNOSIS — R0781 Pleurodynia: Secondary | ICD-10-CM | POA: Diagnosis not present

## 2016-01-06 DIAGNOSIS — M1991 Primary osteoarthritis, unspecified site: Secondary | ICD-10-CM | POA: Diagnosis not present

## 2016-01-06 DIAGNOSIS — W010XXA Fall on same level from slipping, tripping and stumbling without subsequent striking against object, initial encounter: Secondary | ICD-10-CM | POA: Diagnosis not present

## 2016-01-06 DIAGNOSIS — Z87891 Personal history of nicotine dependence: Secondary | ICD-10-CM | POA: Diagnosis not present

## 2016-01-06 DIAGNOSIS — S2242XA Multiple fractures of ribs, left side, initial encounter for closed fracture: Secondary | ICD-10-CM | POA: Diagnosis not present

## 2016-01-06 DIAGNOSIS — F0390 Unspecified dementia without behavioral disturbance: Secondary | ICD-10-CM | POA: Diagnosis not present

## 2016-01-06 DIAGNOSIS — R1012 Left upper quadrant pain: Secondary | ICD-10-CM | POA: Diagnosis not present

## 2016-01-07 DIAGNOSIS — W19XXXA Unspecified fall, initial encounter: Secondary | ICD-10-CM | POA: Diagnosis not present

## 2016-01-07 DIAGNOSIS — S2242XA Multiple fractures of ribs, left side, initial encounter for closed fracture: Secondary | ICD-10-CM | POA: Diagnosis not present

## 2016-01-08 DIAGNOSIS — S2242XA Multiple fractures of ribs, left side, initial encounter for closed fracture: Secondary | ICD-10-CM | POA: Diagnosis not present

## 2016-01-08 DIAGNOSIS — W19XXXA Unspecified fall, initial encounter: Secondary | ICD-10-CM | POA: Diagnosis not present

## 2016-01-09 DIAGNOSIS — I2510 Atherosclerotic heart disease of native coronary artery without angina pectoris: Secondary | ICD-10-CM | POA: Diagnosis not present

## 2016-01-09 DIAGNOSIS — F0390 Unspecified dementia without behavioral disturbance: Secondary | ICD-10-CM | POA: Diagnosis not present

## 2016-01-09 DIAGNOSIS — I10 Essential (primary) hypertension: Secondary | ICD-10-CM | POA: Diagnosis not present

## 2016-01-09 DIAGNOSIS — M1991 Primary osteoarthritis, unspecified site: Secondary | ICD-10-CM | POA: Diagnosis not present

## 2016-01-09 DIAGNOSIS — E119 Type 2 diabetes mellitus without complications: Secondary | ICD-10-CM | POA: Diagnosis not present

## 2016-01-09 DIAGNOSIS — J441 Chronic obstructive pulmonary disease with (acute) exacerbation: Secondary | ICD-10-CM | POA: Diagnosis not present

## 2016-01-10 DIAGNOSIS — F0390 Unspecified dementia without behavioral disturbance: Secondary | ICD-10-CM | POA: Diagnosis not present

## 2016-01-10 DIAGNOSIS — I2510 Atherosclerotic heart disease of native coronary artery without angina pectoris: Secondary | ICD-10-CM | POA: Diagnosis not present

## 2016-01-10 DIAGNOSIS — E119 Type 2 diabetes mellitus without complications: Secondary | ICD-10-CM | POA: Diagnosis not present

## 2016-01-10 DIAGNOSIS — J441 Chronic obstructive pulmonary disease with (acute) exacerbation: Secondary | ICD-10-CM | POA: Diagnosis not present

## 2016-01-10 DIAGNOSIS — J449 Chronic obstructive pulmonary disease, unspecified: Secondary | ICD-10-CM | POA: Diagnosis not present

## 2016-01-10 DIAGNOSIS — M1991 Primary osteoarthritis, unspecified site: Secondary | ICD-10-CM | POA: Diagnosis not present

## 2016-01-10 DIAGNOSIS — I10 Essential (primary) hypertension: Secondary | ICD-10-CM | POA: Diagnosis not present

## 2016-01-11 DIAGNOSIS — R079 Chest pain, unspecified: Secondary | ICD-10-CM | POA: Diagnosis not present

## 2016-01-11 DIAGNOSIS — F0390 Unspecified dementia without behavioral disturbance: Secondary | ICD-10-CM | POA: Diagnosis not present

## 2016-01-11 DIAGNOSIS — Z9181 History of falling: Secondary | ICD-10-CM | POA: Diagnosis not present

## 2016-01-11 DIAGNOSIS — R1013 Epigastric pain: Secondary | ICD-10-CM | POA: Diagnosis not present

## 2016-01-11 DIAGNOSIS — S299XXA Unspecified injury of thorax, initial encounter: Secondary | ICD-10-CM | POA: Diagnosis not present

## 2016-01-11 DIAGNOSIS — S2242XA Multiple fractures of ribs, left side, initial encounter for closed fracture: Secondary | ICD-10-CM | POA: Diagnosis not present

## 2016-01-11 DIAGNOSIS — D696 Thrombocytopenia, unspecified: Secondary | ICD-10-CM | POA: Diagnosis not present

## 2016-01-11 DIAGNOSIS — J9621 Acute and chronic respiratory failure with hypoxia: Secondary | ICD-10-CM | POA: Diagnosis not present

## 2016-01-11 DIAGNOSIS — R0902 Hypoxemia: Secondary | ICD-10-CM | POA: Diagnosis not present

## 2016-01-11 DIAGNOSIS — S279XXA Injury of unspecified intrathoracic organ, initial encounter: Secondary | ICD-10-CM | POA: Diagnosis not present

## 2016-01-11 DIAGNOSIS — J449 Chronic obstructive pulmonary disease, unspecified: Secondary | ICD-10-CM | POA: Diagnosis not present

## 2016-01-11 DIAGNOSIS — R0789 Other chest pain: Secondary | ICD-10-CM | POA: Diagnosis not present

## 2016-01-11 DIAGNOSIS — Z9981 Dependence on supplemental oxygen: Secondary | ICD-10-CM | POA: Diagnosis not present

## 2016-01-11 DIAGNOSIS — I2510 Atherosclerotic heart disease of native coronary artery without angina pectoris: Secondary | ICD-10-CM | POA: Diagnosis not present

## 2016-01-12 ENCOUNTER — Inpatient Hospital Stay (HOSPITAL_COMMUNITY): Payer: Commercial Managed Care - HMO

## 2016-01-12 ENCOUNTER — Encounter (HOSPITAL_COMMUNITY): Payer: Self-pay | Admitting: *Deleted

## 2016-01-12 ENCOUNTER — Inpatient Hospital Stay (HOSPITAL_COMMUNITY)
Admission: EM | Admit: 2016-01-12 | Discharge: 2016-01-17 | DRG: 280 | Disposition: A | Discharge status: Home Health | Payer: Commercial Managed Care - HMO | Source: Other Acute Inpatient Hospital | Attending: Cardiovascular Disease | Admitting: Cardiovascular Disease

## 2016-01-12 DIAGNOSIS — E049 Nontoxic goiter, unspecified: Secondary | ICD-10-CM | POA: Diagnosis present

## 2016-01-12 DIAGNOSIS — K219 Gastro-esophageal reflux disease without esophagitis: Secondary | ICD-10-CM | POA: Diagnosis not present

## 2016-01-12 DIAGNOSIS — I82409 Acute embolism and thrombosis of unspecified deep veins of unspecified lower extremity: Secondary | ICD-10-CM

## 2016-01-12 DIAGNOSIS — W19XXXA Unspecified fall, initial encounter: Secondary | ICD-10-CM | POA: Diagnosis present

## 2016-01-12 DIAGNOSIS — R600 Localized edema: Secondary | ICD-10-CM | POA: Diagnosis not present

## 2016-01-12 DIAGNOSIS — I252 Old myocardial infarction: Secondary | ICD-10-CM | POA: Diagnosis not present

## 2016-01-12 DIAGNOSIS — I493 Ventricular premature depolarization: Secondary | ICD-10-CM | POA: Diagnosis present

## 2016-01-12 DIAGNOSIS — R0902 Hypoxemia: Secondary | ICD-10-CM

## 2016-01-12 DIAGNOSIS — Z955 Presence of coronary angioplasty implant and graft: Secondary | ICD-10-CM | POA: Diagnosis not present

## 2016-01-12 DIAGNOSIS — I2699 Other pulmonary embolism without acute cor pulmonale: Principal | ICD-10-CM

## 2016-01-12 DIAGNOSIS — I2510 Atherosclerotic heart disease of native coronary artery without angina pectoris: Secondary | ICD-10-CM | POA: Diagnosis present

## 2016-01-12 DIAGNOSIS — J42 Unspecified chronic bronchitis: Secondary | ICD-10-CM

## 2016-01-12 DIAGNOSIS — Z91041 Radiographic dye allergy status: Secondary | ICD-10-CM

## 2016-01-12 DIAGNOSIS — Z79899 Other long term (current) drug therapy: Secondary | ICD-10-CM | POA: Diagnosis not present

## 2016-01-12 DIAGNOSIS — Z7984 Long term (current) use of oral hypoglycemic drugs: Secondary | ICD-10-CM | POA: Diagnosis not present

## 2016-01-12 DIAGNOSIS — M940 Chondrocostal junction syndrome [Tietze]: Secondary | ICD-10-CM | POA: Diagnosis present

## 2016-01-12 DIAGNOSIS — J449 Chronic obstructive pulmonary disease, unspecified: Secondary | ICD-10-CM | POA: Diagnosis not present

## 2016-01-12 DIAGNOSIS — R443 Hallucinations, unspecified: Secondary | ICD-10-CM | POA: Diagnosis not present

## 2016-01-12 DIAGNOSIS — I25118 Atherosclerotic heart disease of native coronary artery with other forms of angina pectoris: Secondary | ICD-10-CM | POA: Diagnosis not present

## 2016-01-12 DIAGNOSIS — R609 Edema, unspecified: Secondary | ICD-10-CM | POA: Diagnosis not present

## 2016-01-12 DIAGNOSIS — I82411 Acute embolism and thrombosis of right femoral vein: Secondary | ICD-10-CM | POA: Diagnosis not present

## 2016-01-12 DIAGNOSIS — E785 Hyperlipidemia, unspecified: Secondary | ICD-10-CM | POA: Diagnosis not present

## 2016-01-12 DIAGNOSIS — R079 Chest pain, unspecified: Secondary | ICD-10-CM | POA: Diagnosis not present

## 2016-01-12 DIAGNOSIS — F0390 Unspecified dementia without behavioral disturbance: Secondary | ICD-10-CM | POA: Diagnosis not present

## 2016-01-12 DIAGNOSIS — R7989 Other specified abnormal findings of blood chemistry: Secondary | ICD-10-CM

## 2016-01-12 DIAGNOSIS — I10 Essential (primary) hypertension: Secondary | ICD-10-CM | POA: Diagnosis present

## 2016-01-12 DIAGNOSIS — J438 Other emphysema: Secondary | ICD-10-CM | POA: Diagnosis not present

## 2016-01-12 DIAGNOSIS — R002 Palpitations: Secondary | ICD-10-CM

## 2016-01-12 DIAGNOSIS — S2232XA Fracture of one rib, left side, initial encounter for closed fracture: Secondary | ICD-10-CM | POA: Diagnosis not present

## 2016-01-12 DIAGNOSIS — I214 Non-ST elevation (NSTEMI) myocardial infarction: Secondary | ICD-10-CM | POA: Insufficient documentation

## 2016-01-12 DIAGNOSIS — Z9181 History of falling: Secondary | ICD-10-CM | POA: Diagnosis not present

## 2016-01-12 DIAGNOSIS — J9621 Acute and chronic respiratory failure with hypoxia: Secondary | ICD-10-CM | POA: Diagnosis not present

## 2016-01-12 DIAGNOSIS — J439 Emphysema, unspecified: Secondary | ICD-10-CM | POA: Diagnosis not present

## 2016-01-12 DIAGNOSIS — D696 Thrombocytopenia, unspecified: Secondary | ICD-10-CM | POA: Diagnosis not present

## 2016-01-12 DIAGNOSIS — Z9981 Dependence on supplemental oxygen: Secondary | ICD-10-CM

## 2016-01-12 DIAGNOSIS — S2239XA Fracture of one rib, unspecified side, initial encounter for closed fracture: Secondary | ICD-10-CM

## 2016-01-12 DIAGNOSIS — S2242XA Multiple fractures of ribs, left side, initial encounter for closed fracture: Secondary | ICD-10-CM | POA: Diagnosis not present

## 2016-01-12 DIAGNOSIS — R0602 Shortness of breath: Secondary | ICD-10-CM

## 2016-01-12 DIAGNOSIS — I82439 Acute embolism and thrombosis of unspecified popliteal vein: Secondary | ICD-10-CM

## 2016-01-12 DIAGNOSIS — Z87891 Personal history of nicotine dependence: Secondary | ICD-10-CM | POA: Diagnosis not present

## 2016-01-12 DIAGNOSIS — I248 Other forms of acute ischemic heart disease: Secondary | ICD-10-CM | POA: Diagnosis not present

## 2016-01-12 DIAGNOSIS — I82431 Acute embolism and thrombosis of right popliteal vein: Secondary | ICD-10-CM | POA: Diagnosis not present

## 2016-01-12 DIAGNOSIS — R778 Other specified abnormalities of plasma proteins: Secondary | ICD-10-CM

## 2016-01-12 DIAGNOSIS — R748 Abnormal levels of other serum enzymes: Secondary | ICD-10-CM | POA: Diagnosis not present

## 2016-01-12 HISTORY — DX: Other pulmonary embolism without acute cor pulmonale: I26.99

## 2016-01-12 HISTORY — DX: Dependence on supplemental oxygen: Z99.81

## 2016-01-12 HISTORY — DX: Unspecified dementia, unspecified severity, without behavioral disturbance, psychotic disturbance, mood disturbance, and anxiety: F03.90

## 2016-01-12 HISTORY — DX: Type 2 diabetes mellitus without complications: E11.9

## 2016-01-12 HISTORY — DX: Hyperlipidemia, unspecified: E78.5

## 2016-01-12 LAB — BASIC METABOLIC PANEL
Anion gap: 8 (ref 5–15)
BUN: 11 mg/dL (ref 6–20)
CALCIUM: 9.1 mg/dL (ref 8.9–10.3)
CO2: 28 mmol/L (ref 22–32)
CREATININE: 0.81 mg/dL (ref 0.44–1.00)
Chloride: 102 mmol/L (ref 101–111)
GFR calc non Af Amer: >60 mL/min (ref >60)
Glucose, Bld: 193 mg/dL — ABNORMAL HIGH (ref 65–99)
Potassium: 5.8 mmol/L — ABNORMAL HIGH (ref 3.5–5.1)
SODIUM: 138 mmol/L (ref 135–145)

## 2016-01-12 LAB — CBC
HEMATOCRIT: 38.8 % (ref 36.0–46.0)
Hemoglobin: 12.4 g/dL (ref 12.0–15.0)
MCH: 29.9 pg (ref 26.0–34.0)
MCHC: 32 g/dL (ref 30.0–36.0)
MCV: 93.5 fL (ref 78.0–100.0)
PLATELETS: 111 10*3/uL — AB (ref 150–400)
RBC: 4.15 MIL/uL (ref 3.87–5.11)
RDW: 14.5 % (ref 11.5–15.5)
WBC: 8.1 10*3/uL (ref 4.0–10.5)

## 2016-01-12 LAB — TROPONIN I
Troponin I: 0.28 ng/mL (ref <0.03)
Troponin I: 0.24 ng/mL (ref <0.03)
Troponin I: 0.24 ng/mL (ref <0.03)

## 2016-01-12 LAB — HEPARIN LEVEL (UNFRACTIONATED): Heparin Unfractionated: 0.66 IU/mL (ref 0.30–0.70)

## 2016-01-12 LAB — MRSA PCR SCREENING: MRSA BY PCR: NEGATIVE

## 2016-01-12 MED ORDER — MOMETASONE FURO-FORMOTEROL FUM 100-5 MCG/ACT IN AERO
2.0000 | INHALATION_SPRAY | Freq: Two times a day (BID) | RESPIRATORY_TRACT | Status: DC
  Administered 2016-01-12 – 2016-01-17 (×10): 2 via RESPIRATORY_TRACT
  Filled 2016-01-12 (×3): qty 8.8

## 2016-01-12 MED ORDER — GABAPENTIN 400 MG PO CAPS
400.0000 mg | ORAL_CAPSULE | Freq: Three times a day (TID) | ORAL | Status: DC
  Administered 2016-01-12 – 2016-01-17 (×16): 400 mg via ORAL
  Filled 2016-01-12 (×16): qty 1

## 2016-01-12 MED ORDER — ONDANSETRON HCL 4 MG PO TABS
4.0000 mg | ORAL_TABLET | Freq: Four times a day (QID) | ORAL | Status: DC | PRN
  Administered 2016-01-15: 4 mg via ORAL
  Filled 2016-01-12: qty 1

## 2016-01-12 MED ORDER — ENOXAPARIN SODIUM 40 MG/0.4ML ~~LOC~~ SOLN: 40.0000 mg | SUBCUTANEOUS | Status: DC

## 2016-01-12 MED ORDER — TRAMADOL HCL 50 MG PO TABS
50.0000 mg | ORAL_TABLET | Freq: Four times a day (QID) | ORAL | Status: DC | PRN
  Administered 2016-01-12 – 2016-01-17 (×13): 50 mg via ORAL
  Filled 2016-01-12 (×15): qty 1

## 2016-01-12 MED ORDER — ALBUTEROL SULFATE (2.5 MG/3ML) 0.083% IN NEBU
INHALATION_SOLUTION | RESPIRATORY_TRACT | Status: AC
  Filled 2016-01-12: qty 3

## 2016-01-12 MED ORDER — METHIMAZOLE 5 MG PO TABS
5.0000 mg | ORAL_TABLET | Freq: Two times a day (BID) | ORAL | Status: DC
  Administered 2016-01-12 – 2016-01-17 (×11): 5 mg via ORAL
  Filled 2016-01-12 (×11): qty 1

## 2016-01-12 MED ORDER — TECHNETIUM TC 99M TETROFOSMIN IV KIT
10.0000 | PACK | Freq: Once | INTRAVENOUS | Status: AC | PRN
  Administered 2016-01-12: 10 via INTRAVENOUS

## 2016-01-12 MED ORDER — ATORVASTATIN CALCIUM 80 MG PO TABS
80.0000 mg | ORAL_TABLET | Freq: Every day | ORAL | Status: DC
  Administered 2016-01-12 – 2016-01-16 (×5): 80 mg via ORAL
  Filled 2016-01-12 (×5): qty 1

## 2016-01-12 MED ORDER — ISOSORBIDE MONONITRATE ER 60 MG PO TB24
60.0000 mg | ORAL_TABLET | Freq: Every day | ORAL | Status: DC
  Administered 2016-01-12 – 2016-01-17 (×6): 60 mg via ORAL
  Filled 2016-01-12: qty 2
  Filled 2016-01-12: qty 1
  Filled 2016-01-12 (×4): qty 2
  Filled 2016-01-12: qty 1

## 2016-01-12 MED ORDER — ACETAMINOPHEN 325 MG PO TABS: 650.0000 mg | ORAL_TABLET | ORAL | Status: DC | PRN

## 2016-01-12 MED ORDER — ISOSORBIDE MONONITRATE ER 30 MG PO TB24: 120.0000 mg | ORAL_TABLET | Freq: Every day | ORAL | Status: DC

## 2016-01-12 MED ORDER — CETYLPYRIDINIUM CHLORIDE 0.05 % MT LIQD
7.0000 mL | Freq: Two times a day (BID) | OROMUCOSAL | Status: DC
  Administered 2016-01-12 – 2016-01-16 (×7): 7 mL via OROMUCOSAL

## 2016-01-12 MED ORDER — NITROGLYCERIN 0.4 MG SL SUBL
0.4000 mg | SUBLINGUAL_TABLET | SUBLINGUAL | Status: DC | PRN
  Administered 2016-01-12 (×4): 0.4 mg via SUBLINGUAL
  Filled 2016-01-12 (×3): qty 1

## 2016-01-12 MED ORDER — OXYCODONE-ACETAMINOPHEN 5-325 MG PO TABS
1.0000 | ORAL_TABLET | ORAL | Status: DC | PRN
  Administered 2016-01-12 (×2): 1 via ORAL
  Filled 2016-01-12 (×2): qty 1

## 2016-01-12 MED ORDER — METOPROLOL TARTRATE 12.5 MG HALF TABLET
12.5000 mg | ORAL_TABLET | Freq: Two times a day (BID) | ORAL | Status: DC
  Administered 2016-01-12 – 2016-01-17 (×11): 12.5 mg via ORAL
  Filled 2016-01-12 (×11): qty 1

## 2016-01-12 MED ORDER — FUROSEMIDE 40 MG PO TABS: 20.0000 mg | ORAL_TABLET | Freq: Every day | ORAL | Status: DC

## 2016-01-12 MED ORDER — METOPROLOL TARTRATE 25 MG PO TABS: 25.0000 mg | ORAL_TABLET | Freq: Two times a day (BID) | ORAL | Status: DC

## 2016-01-12 MED ORDER — ASPIRIN EC 81 MG PO TBEC: 81.0000 mg | DELAYED_RELEASE_TABLET | Freq: Every day | ORAL | Status: DC

## 2016-01-12 MED ORDER — HEPARIN (PORCINE) IN NACL 100-0.45 UNIT/ML-% IJ SOLN
750.0000 [IU]/h | INTRAMUSCULAR | Status: DC
  Administered 2016-01-12: 750 [IU]/h via INTRAVENOUS
  Filled 2016-01-12: qty 250

## 2016-01-12 MED ORDER — ALBUTEROL SULFATE (2.5 MG/3ML) 0.083% IN NEBU
2.5000 mg | INHALATION_SOLUTION | Freq: Four times a day (QID) | RESPIRATORY_TRACT | Status: DC
  Administered 2016-01-12 (×2): 2.5 mg via RESPIRATORY_TRACT
  Filled 2016-01-12: qty 3

## 2016-01-12 MED ORDER — ALBUTEROL SULFATE (2.5 MG/3ML) 0.083% IN NEBU
2.5000 mg | INHALATION_SOLUTION | Freq: Four times a day (QID) | RESPIRATORY_TRACT | Status: DC
  Administered 2016-01-12 – 2016-01-17 (×18): 2.5 mg via RESPIRATORY_TRACT
  Filled 2016-01-12 (×19): qty 3

## 2016-01-12 MED ORDER — OXYCODONE-ACETAMINOPHEN 5-325 MG PO TABS: 0.5000 | ORAL_TABLET | ORAL | Status: DC | PRN

## 2016-01-12 MED ORDER — IOPAMIDOL (ISOVUE-300) INJECTION 61%
INTRAVENOUS | Status: AC
  Administered 2016-01-12: 75 mL
  Filled 2016-01-12: qty 75

## 2016-01-12 MED ORDER — DIPHENHYDRAMINE HCL 25 MG PO CAPS
25.0000 mg | ORAL_CAPSULE | Freq: Four times a day (QID) | ORAL | Status: DC | PRN
  Administered 2016-01-12: 25 mg via ORAL
  Filled 2016-01-12: qty 1

## 2016-01-12 MED ORDER — DOCUSATE SODIUM 100 MG PO CAPS
100.0000 mg | ORAL_CAPSULE | Freq: Every day | ORAL | Status: DC
Start: 2016-01-12 — End: 2016-01-17
  Administered 2016-01-12 – 2016-01-17 (×6): 100 mg via ORAL
  Filled 2016-01-12 (×6): qty 1

## 2016-01-12 MED ORDER — PANTOPRAZOLE SODIUM 40 MG PO TBEC
40.0000 mg | DELAYED_RELEASE_TABLET | Freq: Every day | ORAL | Status: DC
  Administered 2016-01-12 – 2016-01-16 (×5): 40 mg via ORAL
  Filled 2016-01-12 (×5): qty 1

## 2016-01-12 MED ORDER — ONDANSETRON HCL 4 MG/2ML IJ SOLN: 4.0000 mg | Freq: Four times a day (QID) | INTRAMUSCULAR | Status: DC | PRN

## 2016-01-12 MED ORDER — HEPARIN BOLUS VIA INFUSION
3000.0000 [IU] | Freq: Once | INTRAVENOUS | Status: AC
  Administered 2016-01-12: 3000 [IU] via INTRAVENOUS
  Filled 2016-01-12: qty 3000

## 2016-01-12 NOTE — Progress Notes (Signed)
CRITICAL VALUE ALERT  Critical value received:  CT results called by radiologist: bilateral PE in all lobes and pulmonary arteries  Date of notification:  01/12/2016   Time of notification:  2111  Critical value read back:Yes.    Nurse who received alert:  Reap, Randon Goldsmithebecca L   MD notified (1st page):  Cardiology fellow  Time of first page:  2115  MD notified (2nd page):  Time of second page:  Responding MD:  Cardiology fellow  Time MD responded:

## 2016-01-12 NOTE — Progress Notes (Signed)
  Patient presented to Nuclear Medicine and is having significant pain, unable to lay flat for full imaging study or undergo stress portion. Feels different from her cardiac chest pain and worse with changing positions and deep inspirations. No relief with SL NTG. Unfortunately, she has already been injected today and cannot be injected twice again tomorrow.   Therefore, nuclear medicine will attempt resting images today if the patient can tolerate this portion so stress images along with Lexiscan injection can be performed tomorrow and test would not be fully delayed by two days. Will adjust scheduling of PO pain medications.   Signed, Ellsworth LennoxBrittany M Lymon Kidney, PA-C 01/12/2016, 1:22 PM Pager: [phone]

## 2016-01-12 NOTE — Progress Notes (Signed)
VASCULAR LAB PRELIMINARY  PRELIMINARY  PRELIMINARY  PRELIMINARY  Bilateral lower extremity venous duplex completed.    Preliminary report:  There is acute, mobile deep vein thrombus noted in the right proximal popliteal vein.  There is acute, occlusive DVT noted in the right distal femoral vein.   Critical results given to [place], PA-C  [facility], Maribelle Hopple, RVT 01/12/2016, 6:03 PM

## 2016-01-12 NOTE — H&P (Signed)
Physician History and Physical    [name] MRN: [id number] DOB/AGE: 1945-07-25 70 y.o. Admit date: 01/12/2016  Primary Care Physician: Unknown Primary Cardiologist: Clifton JamesMcAlhany MD  HPI: 70 yo female with history of CAD, HLD, COPD, GERD admitted for chest pain. Transfer[place] from [place]. Followed by Dr. Clifton JamesMcAlhany, last seen 12/19/15. She has a history of CAD, status post bare metal stent RCA in 1995. Last cardiac cath 02/2006: LAD 20-30%, circumflex 20% mid, RCA widely patent stent in the mid vessel, PDA 20-30%, EF 60%. She was seen by Tereso NewcomerScott Weaver, PA-C April 2013 and had c/o jaw pain, LE edema and dyspnea. Stress myoview 10/29/11 with fixed defect inferior wall c/w scar, no ischemia. Edema resolved with Lasix. Evaluated by Dr. Clifton JamesMcAlhany 12/06/14 and she c/o one episode of severe chest pain. Her chest wall was painful to touch. She also noted palpitations occurring several days per week. He arranged a stress Myoview which showed an inferior fixed defect but no large areas of ischemia. Monitor with PVCs. Lopressor was started. [facility] with SOB, chest pain, LE edema and felt to have a COPD exacerbation and UTI. She was treated with antibiotics and steroids.   She was hospitalized 6/5-6/8 with COPD exacerbation and discharge in stable condition. She ceased tobacco and was doing reasonably well until 6/23 when she was hospitalized after tripping over her oxygen tubing and sustained left 4, 5, 6 rib fractures. She was discharged this past Sunday and had been doing well as per her granddaughter whom she is living with.  This past Tuesday, she developed retrosternal chest pain and shortness of breath which was "different from rib fracture pain". Today her oxygen saturation at home was 85 % and she returned to the ED where she was noted to have O2 sat 85% on 2 L/m. Her CXR without acute findings. EKG revealed ST abnormality but was unchanged c/w 6/5. Initial troponin elevated  0.011. She complains of cough and chest wall pain. She is anxious and fearful she will not improve. She has had some wheezing and ankle swelling which is not new. She denies orthopnea or PND. There is no exertional chest pain. She took a NTG today but it did not help her chest wall pain or shortness of breath.  Points to pain in lower midsternal/epigastric region then periumbilical region. Describes CP as dull and constant with occasional radiation to RUE and back.  Troponin 0.011-->0.069-->0.088-->0.069.  Now rates pain at 5/10 which has improved.     Review of systems complete and found to be negative unless listed above   No family history of premature CAD in 1st degree relatives. Social History   Social History  . Marital Status: Widowed    Spouse Name: N/A  . Number of Children: 1  . Years of Education: N/A   Occupational History  . DISABILITY    Social History Main Topics  . Smoking status: Former Smoker -- 0.50 packs/day for 56 years    Types: Cigarettes  . Smokeless tobacco: Never Used     Comment: pt using vapor cig  . Alcohol Use: No  . Drug Use: No  . Sexual Activity: Not on file   Other Topics Concern  . Not on file   Social History Narrative     Prescriptions prior to admission  Medication Sig Dispense Refill Last Dose  . albuterol (PROVENTIL) (2.5 MG/3ML) 0.083% nebulizer solution Take 2.5 mg by nebulization 4 (four) times daily.    Taking  .  atorvastatin (LIPITOR) 80 MG tablet TAKE 1 TABLET BY MOUTH EVERY DAY 30 tablet 11 Taking  . budesonide-formoterol (SYMBICORT) 80-4.5 MCG/ACT inhaler Inhale 2 puffs into the lungs 2 (two) times daily.   Taking  . Docusate Calcium (STOOL SOFTENER PO) Take 1 tablet by mouth daily.   Taking  . furosemide (LASIX) 20 MG tablet Take 1 tablet (20 mg total) by mouth daily as needed. 90 tablet 3   . gabapentin (NEURONTIN) 400 MG capsule Take 400 mg by mouth 3 (three) times daily.    Taking  . isosorbide mononitrate (IMDUR) 120  MG 24 hr tablet TAKE 1 TABLET BY MOUTH EVERY DAY 30 tablet 11   . metFORMIN (GLUCOPHAGE) 500 MG tablet TAKE 1 DAILY IN THE MORNING  0 Taking  . methimazole (TAPAZOLE) 5 MG tablet Take 5 mg by mouth 2 (two) times daily.  0 Taking  . metoprolol tartrate (LOPRESSOR) 25 MG tablet TAKE 1 TABLET BY MOUTH TWICE A DAY 60 tablet 4 Taking  . nitroGLYCERIN (NITROSTAT) 0.4 MG SL tablet Place 0.4 mg under the tongue every 5 (five) minutes as needed for chest pain (X3 DOSES BEFORE CALLING 911).   Taking  . NON FORMULARY Place into the nose daily. OXYGEN ALL DAY   Taking  . omeprazole (PRILOSEC) 40 MG capsule Take 40 mg by mouth daily.  3 Taking  . ondansetron (ZOFRAN) 4 MG tablet Take 4 mg by mouth every 6 (six) hours as needed for nausea or vomiting.   Taking  . oxyCODONE-acetaminophen (PERCOCET/ROXICET) 5-325 MG per tablet Take 0.5-2 tablets by mouth every 4 (four) hours as needed for severe pain.   Taking  . VENTOLIN HFA 108 (90 Base) MCG/ACT inhaler    Taking    Physical Exam: Blood pressure 108/65, pulse 87, resp. rate 15, height 5\' 7"  (1.702 m), weight 131 lb 13.4 oz (59.8 kg), SpO2 98 %.  General: NAD Neck: No JVD, no thyromegaly or thyroid nodule.  Lungs: No rales or wheezes. CV: Nondisplaced PMI.  Heart regular S1/S2, no S3/S4, no murmur.  No peripheral edema.  Abdomen: Soft, nontender, no distention.  Skin: Intact without lesions or rashes.  Neurologic: Alert and oriented x 3.  Psych: Normal affect. HEENT: Normal.   Labs:   Lab Results  Component Value Date   WBC 9.7 06/13/2014   HGB 15.1* 06/13/2014   HCT 46.5* 06/13/2014   MCV 89.1 06/13/2014   PLT 192 06/13/2014   No results for input(s): NA, K, CL, CO2, BUN, CREATININE, CALCIUM, PROT, BILITOT, ALKPHOS, ALT, AST, GLUCOSE in the last 168 hours.  Invalid input(s): LABALBU Lab Results  Component Value Date   CKTOTAL 62 09/08/2008   CKMB 1.6 09/08/2008   TROPONINI <0.01        NO INDICATION OF MYOCARDIAL INJURY. 09/08/2008     Lab Results  Component Value Date   CHOL 135 11/27/2013   CHOL 122 03/19/2012   CHOL 112 06/28/2010   Lab Results  Component Value Date   HDL 39.70 11/27/2013   HDL 41.40 03/19/2012   HDL 37.50* 06/28/2010   Lab Results  Component Value Date   LDLCALC 66 11/27/2013   LDLCALC 53 03/19/2012   LDLCALC 54 06/28/2010   Lab Results  Component Value Date   TRIG 146.0 11/27/2013   TRIG 138.0 03/19/2012   TRIG 105.0 06/28/2010   Lab Results  Component Value Date   CHOLHDL 3 11/27/2013   CHOLHDL 3 03/19/2012   CHOLHDL 3 06/28/2010   No results  found for: LDLDIRECT        ASSESSMENT AND PLAN:  1. Chest pain in context of CAD: Troponins in [place] had been minimally elevated and are now trending down. Will continue outpatient meds. Does not need heparin at this time. Nuclear MPI study negative for ischemia on 12/28/14.  Will obtain echocardiogram to assess cardiac structure and function. Could consider repeat stress test Eugenie Birks). Will defer to rounding team.  2. Hyperlipidemia: Continue statin.  3. Essential HTN: Controlled. No changes.  4. Palpitations/PVC's: Currently controlled on metoprolol.  5. Tobacco abuse in remission.  6. COPD: Stable. Continue current medical therapy.  Signed: Prentice Docker, M.D., F.A.C.C. 01/12/2016, 4:38 AM

## 2016-01-12 NOTE — Progress Notes (Signed)
Troponin increased to 0.28 from 0.069. Keep NPO for possible stress test. Start IV heparin.    RaleighBhagat,Moriyah Byington, PA

## 2016-01-12 NOTE — Progress Notes (Signed)
CRITICAL VALUE ALERT  Critical value received:  Troponin 0.28  Date of notification:  01/12/2016   Time of notification:  0556  Critical value read back:Yes.    Nurse who received alert:  Reap, Randon Goldsmithebecca L   MD notified (1st page):  Bhagat  Time of first page:  0600  MD notified (2nd page):   Time of second page:  Responding MD:  Iver NestleBhagat  Time MD responded:

## 2016-01-12 NOTE — Progress Notes (Signed)
PATIENT ID: [id number] with CAD s/p BMS to RCA 1995, hypertension, hyperlipidemia and COPD here with chest pain.  Complicated by recent fall and left rib fracture.  SUBJECTIVE:  Continues to have pain 8/10.  This is different from her rib pain.     PHYSICAL EXAM Filed Vitals:   01/12/16 0400 01/12/16 0600 01/12/16 0646 01/12/16 0655  BP: 108/65 110/69 106/70 114/74  Pulse: 87 86 88 91  Temp:      TempSrc:      Resp: 15 15 19 24   Height:      Weight:      SpO2: 98% 98% 95% 96%   General: Chronically ill-appearing Neck: No JVD Lungs:  Diffuse expiratory wheezeing  Heart:  RRR.  No m/r/g.  Abdomen:  Soft, NT, ND.  +BS Extremities:  WWP.  Trace edema to the R ankle.   LABS: Lab Results  Component Value Date   TROPONINI 0.28* 01/12/2016   Results for orders placed or performed during the hospital encounter of 01/12/16 (from the past 24 hour(s))  MRSA PCR Screening     Status: None   Collection Time: 01/12/16  3:53 AM  Result Value Ref Range   MRSA by PCR NEGATIVE NEGATIVE  CBC     Status: Abnormal   Collection Time: 01/12/16  4:38 AM  Result Value Ref Range   WBC 8.1 4.0 - 10.5 K/uL   RBC 4.15 3.87 - 5.11 MIL/uL   Hemoglobin 12.4 12.0 - 15.0 g/dL   HCT 16.138.8 09.636.0 - 04.546.0 %   MCV 93.5 78.0 - 100.0 fL   MCH 29.9 26.0 - 34.0 pg   MCHC 32.0 30.0 - 36.0 g/dL   RDW 40.914.5 81.111.5 - 91.415.5 %   Platelets 111 (L) 150 - 400 K/uL  Troponin I     Status: Abnormal   Collection Time: 01/12/16  4:38 AM  Result Value Ref Range   Troponin I 0.28 (HH) <0.03 ng/mL  Basic metabolic panel     Status: Abnormal   Collection Time: 01/12/16  4:38 AM  Result Value Ref Range   Sodium 138 135 - 145 mmol/L   Potassium 5.8 (H) 3.5 - 5.1 mmol/L   Chloride 102 101 - 111 mmol/L   CO2 28 22 - 32 mmol/L   Glucose, Bld 193 (H) 65 - 99 mg/dL   BUN 11 6 - 20 mg/dL   Creatinine, Ser 7.820.81 0.44 - 1.00 mg/dL   Calcium 9.1 8.9 - 95.610.3 mg/dL   GFR calc non Af Amer >60 >60 mL/min   GFR calc Af Amer >60 >60 mL/min   Anion gap 8 5 - 15   No intake or output data in the 24 hours ending 01/12/16 0838  Telemetry: Sinus rhythm.  PVCs.   ASSESSMENT AND PLAN:  Active Problems:   Chest pain   # Elevated troponin: # Chest pain: Ms. [name] has two types of chest pain.  One is clearly from her rib fractures.  The other, more recent pain is concerning for angina, as there was associated nausea, shortness of breath and diaphoresis.  It is unclear whether this is due to ACS vs. Demand ischemia from hypoxia and lung disease.  It has been a year since her last stress so we will obtain a Lexiscan Myoview and echo to assess for wall motion.  Her outside CXR was negative for acute process.  Continue heparin drip, aspirin, and atorvastatin.  We will reduce her home Imdur to 60mg  and metoprolol to 12.5mg   due to low BPs.    # R LE edema: Ms. [name] reports R LE edema.  Given her hypoxia we will check a R LE ultrasound to look for DVT.   # COPD: Restart home nebs and inhalers.    Neosha Switalski C. Duke Salviaandolph, MD, [facility] 01/12/2016 8:38 AM

## 2016-01-12 NOTE — Progress Notes (Signed)
Pt complaining of chest pain this morning, starting around 0630. Troponin elevated at 0.28 and cardiologist notified. SL nitro given to patient x3 (see MAR) with no relief of chest pain. MD notified for further pain management. Percocet ordered and given per MD order.

## 2016-01-12 NOTE — Progress Notes (Addendum)
ANTICOAGULATION CONSULT NOTE - Follow Up Consult  Pharmacy Consult for heparin Indication: chest pain/ACS & confirmed PE and DVT  Allergies  Allergen Reactions  . Avelox [Moxifloxacin Hcl In Nacl] Anaphylaxis  . Contrast Media [Iodinated Diagnostic Agents] Shortness Of Breath  . Daliresp [Roflumilast] Other (See Comments)    tremors  . Fosamax [Alendronate Sodium] Nausea And Vomiting  . Lexapro [Escitalopram Oxalate] Diarrhea    Patient Measurements: Height: 5\' 7"  (170.2 cm) Weight: 131 lb 13.4 oz (59.8 kg) IBW/kg (Calculated) : 61.6 Heparin Dosing Weight: 59.8 kg  Vital Signs: Temp: 98.2 F (36.8 C) (06/29 1126) Temp Source: Oral (06/29 1126) BP: 140/95 mmHg (06/29 1126) Pulse Rate: 84 (06/29 1126)  Labs:  Recent Labs  01/12/16 0438 01/12/16 1154 01/12/16 1535  HGB 12.4  --   --   HCT 38.8  --   --   PLT 111*  --   --   HEPARINUNFRC  --   --  0.66  CREATININE 0.81  --   --   TROPONINI 0.28* 0.24*  --     Estimated Creatinine Clearance: 61 mL/min (by C-G formula based on Cr of 0.81).   Medications:  Scheduled:  . albuterol  2.5 mg Nebulization QID  . albuterol      . antiseptic oral rinse  7 mL Mouth Rinse BID  . [START ON 01/13/2016] aspirin EC  81 mg Oral Daily  . atorvastatin  80 mg Oral q1800  . docusate sodium  100 mg Oral Daily  . gabapentin  400 mg Oral TID  . isosorbide mononitrate  60 mg Oral Daily  . methimazole  5 mg Oral BID  . metoprolol tartrate  12.5 mg Oral BID  . mometasone-formoterol  2 puff Inhalation BID  . pantoprazole  40 mg Oral Q1200   Infusions:  . heparin 750 Units/hr (01/12/16 [id number])    Assessment: 70 yo female with ACS and confirmed DVT&PE is currently on therapeutic heparin.  Heparin level is 0.66.  Goal of Therapy:  Heparin level 0.3-0.7 units/ml Monitor platelets by anticoagulation protocol: Yes   Plan:  - continue heparin at 750 units/hr - 8hr heparin level to confirm  Gavina Dildine, Tsz-Yin 01/12/2016,4:35 PM

## 2016-01-12 NOTE — Progress Notes (Signed)
  I was present in the room when the patient's Venous Duplex study was being performed and showed a thrombus in the right proximal popliteal vein.   She is currently on IV Heparin for NSTEMI. Will continue with Heparin for now. Dr. Duke Salviaandolph made aware. Will order STAT CT Chest. The patient had a listed contrast allergy. I asked the patient and her granddaughter about this and they report she has had contrast multiple times in the past with no severe anaphylaxis. Did have a mild rash at her IV site. Will give Benadryl prior to her imaging study.   Patient has been hallucinating throughout the day according to family. Will switch Percocet to Tramadol. Encouraged family to open window shades during the day and keep lights on so the patient is more likely to sleep at night in an effort to help prevent acute delirium during her hospital stay.  Signed, Ellsworth LennoxBrittany M Judithann Villamar, PA-C 01/12/2016, 7:13 PM

## 2016-01-12 NOTE — Progress Notes (Signed)
ANTICOAGULATION CONSULT NOTE - Initial Consult  Pharmacy Consult for heparin Indication: chest pain/ACS  Allergies  Allergen Reactions  . Avelox [Moxifloxacin Hcl In Nacl] Anaphylaxis  . Contrast Media [Iodinated Diagnostic Agents] Shortness Of Breath  . Daliresp [Roflumilast] Other (See Comments)    tremors  . Fosamax [Alendronate Sodium] Nausea And Vomiting  . Lexapro [Escitalopram Oxalate] Diarrhea    Patient Measurements: Height: 5\' 7"  (170.2 cm) Weight: 131 lb 13.4 oz (59.8 kg) IBW/kg (Calculated) : 61.6 Heparin Dosing Weight: 60 kg  Vital Signs: Temp: 97.2 F (36.2 C) (06/29 0333) Temp Source: Oral (06/29 0333) BP: 108/65 mmHg (06/29 0400) Pulse Rate: 87 (06/29 0400)  Labs:  Recent Labs  01/12/16 0438  HGB 12.4  HCT 38.8  PLT 111*  CREATININE 0.81  TROPONINI 0.28*    Estimated Creatinine Clearance: 61 mL/min (by C-G formula based on Cr of 0.81).   Medical History: Past Medical History  Diagnosis Date  . Hyperlipidemia   . GERD (gastroesophageal reflux disease)   . H/O hiatal hernia   . History of MI (myocardial infarction)     1995 S/P  BMS to RCA  . Right ureteral stone   . History of kidney stones   . Coronary atherosclerosis of native coronary vessel cardiologist-  dr Clifton Jamesmcalhany    hx  MI 1995--  BMS X1 to RCA   . S/p bare metal coronary artery stent     1995 to  RCA  . COPD with emphysema (HCC)   . Dyspnea on exertion   . Smokers' cough (HCC)   . Productive cough   . RLS (restless legs syndrome)   . Arthritis   . Wears dentures     UPPER  . Hematuria   . Chronic kidney disease   . Renal stone 04/2014  . Dementia   . On home oxygen therapy     1L    Medications:  Prescriptions prior to admission  Medication Sig Dispense Refill Last Dose  . albuterol (PROVENTIL) (2.5 MG/3ML) 0.083% nebulizer solution Take 2.5 mg by nebulization 4 (four) times daily.    Taking  . atorvastatin (LIPITOR) 80 MG tablet TAKE 1 TABLET BY MOUTH EVERY DAY 30  tablet 11 Taking  . budesonide-formoterol (SYMBICORT) 80-4.5 MCG/ACT inhaler Inhale 2 puffs into the lungs 2 (two) times daily.   Taking  . Docusate Calcium (STOOL SOFTENER PO) Take 1 tablet by mouth daily.   Taking  . furosemide (LASIX) 20 MG tablet Take 1 tablet (20 mg total) by mouth daily as needed. 90 tablet 3   . gabapentin (NEURONTIN) 400 MG capsule Take 400 mg by mouth 3 (three) times daily.    Taking  . isosorbide mononitrate (IMDUR) 120 MG 24 hr tablet TAKE 1 TABLET BY MOUTH EVERY DAY 30 tablet 11   . metFORMIN (GLUCOPHAGE) 500 MG tablet TAKE 1 DAILY IN THE MORNING  0 Taking  . methimazole (TAPAZOLE) 5 MG tablet Take 5 mg by mouth 2 (two) times daily.  0 Taking  . metoprolol tartrate (LOPRESSOR) 25 MG tablet TAKE 1 TABLET BY MOUTH TWICE A DAY 60 tablet 4 Taking  . nitroGLYCERIN (NITROSTAT) 0.4 MG SL tablet Place 0.4 mg under the tongue every 5 (five) minutes as needed for chest pain (X3 DOSES BEFORE CALLING 911).   Taking  . NON FORMULARY Place into the nose daily. OXYGEN ALL DAY   Taking  . omeprazole (PRILOSEC) 40 MG capsule Take 40 mg by mouth daily.  3 Taking  . ondansetron (  ZOFRAN) 4 MG tablet Take 4 mg by mouth every 6 (six) hours as needed for nausea or vomiting.   Taking  . oxyCODONE-acetaminophen (PERCOCET/ROXICET) 5-325 MG per tablet Take 0.5-2 tablets by mouth every 4 (four) hours as needed for severe pain.   Taking  . VENTOLIN HFA 108 (90 Base) MCG/ACT inhaler    Taking    Assessment: 70 yo F with chest pain and troponin increased from 0.28 to 0.69.   Pharmacy consulted to dose heparin.  Wt ~ 60 kg. Creat WNL, Hg 12.4, pltc low at 111.   Goal of Therapy:  Heparin level 0.3-0.7 units/ml Monitor platelets by anticoagulation protocol: Yes   Plan:  Give 3000 units bolus x 1 Start heparin infusion at 750 units/hr Check anti-Xa level in 8 hours and daily while on heparin Continue to monitor H&H and platelets  Herby AbrahamMichelle T. Ankit Degregorio, Pharm.D. [phone] 01/12/2016 6:33 AM

## 2016-01-12 NOTE — Plan of Care (Signed)
[facility]  Called regarding finding of bilateral PE involving all lobes.  She is already on a Heparin drip for the ACS protocol with the same anti-Xa goal 0.3 to 0.7.  Her value is 0.66.    Lance Morinlivia Gilbert, MD

## 2016-01-12 NOTE — Progress Notes (Signed)
Initial Nutrition Assessment  DOCUMENTATION CODES:   Not applicable  INTERVENTION:   Advance diet as medically appropriate, add supplements when/as able  NUTRITION DIAGNOSIS:   Inadequate oral intake related to inability to eat as evidenced by NPO status  GOAL:   Patient will meet greater than or equal to 90% of their needs  MONITOR:   PO intake, Supplement acceptance, Labs, Weight trends, I & O's  REASON FOR ASSESSMENT:   Malnutrition Screening Tool  ASSESSMENT:   70 yo Female wiith CAD s/p BMS to RCA 1995, hypertension, hyperlipidemia and COPD here with chest pain.  Spoke with patient and her Grand-daughter at bedside. Reports pt hasn't been eating well PTA due to not feeling well. Currently NPO for procedure. Pt was amenable to trying Ensure Enlive when/as able. Pt has had a 6% weight loss since March 2017, however, not significant for time frame. No muscle or subcutaneous fat depletion noticed.  Diet Order:  Diet NPO time specified Except for: Sips with Meds  Skin:  Reviewed, no issues  Last BM:  N/A  Height:   Ht Readings from Last 1 Encounters:  01/12/16 5\' 7"  (1.702 m)    Weight:   Wt Readings from Last 1 Encounters:  01/12/16 131 lb 13.4 oz (59.8 kg)    Wt Readings from Last 10 Encounters:  01/12/16 131 lb 13.4 oz (59.8 kg)  12/19/15 135 lb 12.8 oz (61.598 kg)  09/19/15 140 lb 1.9 oz (63.558 kg)  01/07/15 128 lb (58.06 kg)  12/27/14 125 lb (56.7 kg)  12/06/14 125 lb 6.4 oz (56.881 kg)  06/07/14 126 lb 12.8 oz (57.516 kg)  05/17/14 129 lb 4 oz (58.627 kg)  03/08/14 133 lb (60.328 kg)  02/18/14 137 lb 5 oz (62.285 kg)    Ideal Body Weight:  61.3 kg  BMI:  Body mass index is 20.64 kg/(m^2).  Estimated Nutritional Needs:   Kcal:  1500-1700  Protein:  70-80 gm  Fluid:  1.5-1.7 L  EDUCATION NEEDS:   No education needs identified at this time  Maureen ChattersKatie Gurman [place], RD, LDN Pager #: [phone] After-Hours Pager #: [phone]

## 2016-01-13 ENCOUNTER — Inpatient Hospital Stay (HOSPITAL_COMMUNITY): Payer: Commercial Managed Care - HMO

## 2016-01-13 DIAGNOSIS — I82401 Acute embolism and thrombosis of unspecified deep veins of right lower extremity: Secondary | ICD-10-CM

## 2016-01-13 DIAGNOSIS — I248 Other forms of acute ischemic heart disease: Secondary | ICD-10-CM

## 2016-01-13 DIAGNOSIS — R079 Chest pain, unspecified: Secondary | ICD-10-CM

## 2016-01-13 DIAGNOSIS — I2699 Other pulmonary embolism without acute cor pulmonale: Principal | ICD-10-CM

## 2016-01-13 LAB — ECHOCARDIOGRAM COMPLETE
CHL CUP MV DEC (S): 183
CHL CUP TV REG PEAK VELOCITY: 362 cm/s
E decel time: 183 msec
FS: 25 % — AB (ref 28–44)
HEIGHTINCHES: 67 in
IV/PV OW: 0.91
LA diam end sys: 29 mm
LA vol: 42.9 mL
LADIAMINDEX: 1.72 cm/m2
LASIZE: 29 mm
LAVOLA4C: 47.1 mL
LAVOLIN: 25.4 mL/m2
LVOT SV: 51 mL
LVOT VTI: 19.9 cm
LVOT area: 2.54 cm2
LVOT diameter: 18 mm
LVOT peak grad rest: 4 mmHg
LVOT peak vel: 104 cm/s
MVPKEVEL: 0.7 m/s
PW: 11 mm — AB (ref 0.6–1.1)
TAPSE: 17.5 mm
TRMAXVEL: 362 cm/s
WEIGHTICAEL: 2109.36 [oz_av]

## 2016-01-13 LAB — CBC
HCT: 38.3 % (ref 36.0–46.0)
Hemoglobin: 12.1 g/dL (ref 12.0–15.0)
MCH: 29.7 pg (ref 26.0–34.0)
MCHC: 31.6 g/dL (ref 30.0–36.0)
MCV: 93.9 fL (ref 78.0–100.0)
PLATELETS: 128 10*3/uL — AB (ref 150–400)
RBC: 4.08 MIL/uL (ref 3.87–5.11)
RDW: 14.7 % (ref 11.5–15.5)
WBC: 9 10*3/uL (ref 4.0–10.5)

## 2016-01-13 LAB — HEPARIN LEVEL (UNFRACTIONATED): HEPARIN UNFRACTIONATED: 0.65 [IU]/mL (ref 0.30–0.70)

## 2016-01-13 MED ORDER — RIVAROXABAN 15 MG PO TABS
15.0000 mg | ORAL_TABLET | Freq: Two times a day (BID) | ORAL | Status: DC
  Administered 2016-01-13 – 2016-01-17 (×9): 15 mg via ORAL
  Filled 2016-01-13 (×10): qty 1

## 2016-01-13 MED ORDER — ACETAMINOPHEN 325 MG PO TABS
650.0000 mg | ORAL_TABLET | Freq: Four times a day (QID) | ORAL | Status: DC
  Administered 2016-01-13 – 2016-01-17 (×17): 650 mg via ORAL
  Filled 2016-01-13 (×17): qty 2

## 2016-01-13 MED ORDER — RIVAROXABAN 20 MG PO TABS: 20.0000 mg | ORAL_TABLET | Freq: Every day | ORAL | Status: DC

## 2016-01-13 MED ORDER — LIDOCAINE 5 % EX PTCH
1.0000 | MEDICATED_PATCH | CUTANEOUS | Status: DC
  Administered 2016-01-13 – 2016-01-17 (×5): 1 via TRANSDERMAL
  Filled 2016-01-13 (×7): qty 1

## 2016-01-13 MED ORDER — ALBUTEROL SULFATE (2.5 MG/3ML) 0.083% IN NEBU
2.5000 mg | INHALATION_SOLUTION | RESPIRATORY_TRACT | Status: DC | PRN
  Administered 2016-01-13 – 2016-01-16 (×3): 2.5 mg via RESPIRATORY_TRACT
  Filled 2016-01-13 (×3): qty 3

## 2016-01-13 MED ORDER — TECHNETIUM TC 99M TETROFOSMIN IV KIT: 10.0000 | PACK | Freq: Once | INTRAVENOUS | Status: DC | PRN

## 2016-01-13 NOTE — Progress Notes (Signed)
PATIENT ID: [id number] with CAD s/p BMS to RCA 1995, hypertension, hyperlipidemia and COPD here with chest pain.  Complicated by recent fall and left rib fracture.  SUBJECTIVE:  Less hallucination after switching to tramadol. However, her pain is not as well-controlled.   PHYSICAL EXAM Filed Vitals:   01/13/16 0400 01/13/16 0732 01/13/16 0733 01/13/16 0759  BP: 133/77   120/67  Pulse: 85   92  Temp: 97 F (36.1 C)   97.8 F (36.6 C)  TempSrc: Oral   Oral  Resp: 16   26  Height:      Weight:      SpO2: 99% 99% 98% 98%   General: Chronically ill-appearing Neck: JVP 1 cm above clavicle at 45 degrees Lungs:  Diffuse expiratory wheezeing  Heart:  RRR.  No m/r/g.  Abdomen:  Soft, NT, ND.  +BS Extremities:  WWP.  No edema  LABS: Lab Results  Component Value Date   TROPONINI 0.24* 01/12/2016   Results for orders placed or performed during the hospital encounter of 01/12/16 (from the past 24 hour(s))  Troponin I     Status: Abnormal   Collection Time: 01/12/16 11:54 AM  Result Value Ref Range   Troponin I 0.24 (HH) <0.03 ng/mL  Heparin level (unfractionated)     Status: None   Collection Time: 01/12/16  3:35 PM  Result Value Ref Range   Heparin Unfractionated 0.66 0.30 - 0.70 IU/mL  Troponin I     Status: Abnormal   Collection Time: 01/12/16  4:14 PM  Result Value Ref Range   Troponin I 0.24 (HH) <0.03 ng/mL  CBC     Status: Abnormal   Collection Time: 01/13/16  4:29 AM  Result Value Ref Range   WBC 9.0 4.0 - 10.5 K/uL   RBC 4.08 3.87 - 5.11 MIL/uL   Hemoglobin 12.1 12.0 - 15.0 g/dL   HCT 40.938.3 81.136.0 - 91.446.0 %   MCV 93.9 78.0 - 100.0 fL   MCH 29.7 26.0 - 34.0 pg   MCHC 31.6 30.0 - 36.0 g/dL   RDW 78.214.7 95.611.5 - 21.315.5 %   Platelets 128 (L) 150 - 400 K/uL  Heparin level (unfractionated)     Status: None   Collection Time: 01/13/16  4:30 AM  Result Value Ref Range   Heparin Unfractionated 0.65 0.30 - 0.70 IU/mL    Intake/Output Summary (Last 24 hours) at 01/13/16 0835 Last  data filed at 01/13/16 0030  Gross per 24 hour  Intake    480 ml  Output    350 ml  Net    130 ml    Telemetry: Sinus rhythm.  PVCs.   ASSESSMENT AND PLAN:  Active Problems:   CORONARY ATHEROSCLEROSIS, NATIVE VESSEL   COPD (chronic obstructive pulmonary disease) (HCC)   GERD   Edema   Chest pain   NSTEMI (non-ST elevated myocardial infarction) (HCC)   Hyperlipidemia   # Bilateral PE: # R LE DVT:  # Demand ischemia: Ms. Laural BenesJohnson has extensive R LE DVT and bilateral PEs.  She was already started on heparin yesterday.  It seems that the troponin elevation is related to her PE and demand ischemia rather than ACS.  She had a hard time laying flat for the first part of her stress test yesterday.  We will discontinue as it does not seem indicated at this time.  We will stop heparin and start Xarelto 15mg  bid x20 days followed by 20 mg daily.  It is likely that this  occurred due to immobility after her rib fracture.  Plan for at least 3 months of therapy.  Echo is pending to assess RV function.  We will stop aspirin while on Xarelto.  # Hypertension: Home regimen of Imdur and metoprolol were reduced on admission.  BP is well-controlled.   # COPD: Continue home nebs and inhalers.    Etana Beets C. Duke Salviaandolph, MD, [facility] 01/13/2016 8:35 AM

## 2016-01-13 NOTE — Discharge Instructions (Addendum)
**  PLEASE REMEMBER TO BRING ALL OF YOUR MEDICATIONS TO EACH OF YOUR FOLLOW-UP OFFICE VISITS.   Information on my medicine - XARELTO (rivaroxaban)  This medication education was reviewed with me or my healthcare representative as part of my discharge preparation.  The pharmacist that spoke with me during my hospital stay was:  Renaee MundaHiatt, Kendra P, [facility]  WHY WAS XARELTO PRESCRIBED FOR YOU? Xarelto was prescribed to treat blood clots that may have been found in the veins of your legs (deep vein thrombosis) or in your lungs (pulmonary embolism) and to reduce the risk of them occurring again.  What do you need to know about Xarelto? The starting dose is one 15 mg tablet taken TWICE daily with food for the FIRST 21 DAYS then on 7/21 the dose is changed to one 20 mg tablet taken ONCE A DAY with your evening meal.  DO NOT stop taking Xarelto without talking to the health care provider who prescribed the medication.  Refill your prescription for 20 mg tablets before you run out.  After discharge, you should have regular check-up appointments with your healthcare provider that is prescribing your Xarelto.  In the future your dose may need to be changed if your kidney function changes by a significant amount.  What do you do if you miss a dose? If you are taking Xarelto TWICE DAILY and you miss a dose, take it as soon as you remember. You may take two 15 mg tablets (total 30 mg) at the same time then resume your regularly scheduled 15 mg twice daily the next day.  If you are taking Xarelto ONCE DAILY and you miss a dose, take it as soon as you remember on the same day then continue your regularly scheduled once daily regimen the next day. Do not take two doses of Xarelto at the same time.   Important Safety Information Xarelto is a blood thinner medicine that can cause bleeding. You should call your healthcare provider right away if you experience any of the following: ? Bleeding from an injury or  your nose that does not stop. ? Unusual colored urine (red or dark brown) or unusual colored stools (red or black). ? Unusual bruising for unknown reasons. ? A serious fall or if you hit your head (even if there is no bleeding).  Some medicines may interact with Xarelto and might increase your risk of bleeding while on Xarelto. To help avoid this, consult your healthcare provider or pharmacist prior to using any new prescription or non-prescription medications, including herbals, vitamins, non-steroidal anti-inflammatory drugs (NSAIDs) and supplements.  This website has more information on Xarelto: [email or web address].xarelto.[email or web address].

## 2016-01-13 NOTE — Progress Notes (Signed)
  Echocardiogram 2D Echocardiogram has been performed.  Arvil ChacoFoster, Tychelle Purkey 01/13/2016, 10:25 AM

## 2016-01-14 LAB — CBC
HCT: 37.6 % (ref 36.0–46.0)
Hemoglobin: 11.8 g/dL — ABNORMAL LOW (ref 12.0–15.0)
MCH: 29.9 pg (ref 26.0–34.0)
MCHC: 31.4 g/dL (ref 30.0–36.0)
MCV: 95.4 fL (ref 78.0–100.0)
PLATELETS: 130 10*3/uL — AB (ref 150–400)
RBC: 3.94 MIL/uL (ref 3.87–5.11)
RDW: 14.9 % (ref 11.5–15.5)
WBC: 7.7 10*3/uL (ref 4.0–10.5)

## 2016-01-14 LAB — HEPARIN LEVEL (UNFRACTIONATED)

## 2016-01-14 MED ORDER — RIVAROXABAN 20 MG PO TABS: 20.0000 mg | ORAL_TABLET | Freq: Every day | ORAL | Status: DC

## 2016-01-14 MED ORDER — KETOROLAC TROMETHAMINE 10 MG PO TABS
10.0000 mg | ORAL_TABLET | Freq: Once | ORAL | Status: AC
  Administered 2016-01-14: 10 mg via ORAL
  Filled 2016-01-14: qty 1

## 2016-01-14 NOTE — Progress Notes (Signed)
   PATIENT ID: [id number] with CAD s/p BMS to RCA 1995, hypertension, hyperlipidemia and COPD here with chest pain.  Complicated by recent fall and left rib fracture.  Found to have PTE.  SUBJECTIVE: SOB is stable.  + chest pain.   PHYSICAL EXAM Filed Vitals:   01/14/16 0817 01/14/16 0818 01/14/16 0857 01/14/16 1140  BP:   150/91   Pulse:   101   Temp:      TempSrc:      Resp:      Height:      Weight:      SpO2: 98% 97%  97%   General: Chronically ill-appearing HEENT:  scle clear, conj pink, OP clear Neck: JVP 1 cm above clavicle at 45 degrees Lungs:  Diffuse expiratory wheezeing  Heart:  RRR.  No m/r/g.  Abdomen:  Soft, NT, ND.  +BS Extremities:    No edema  LABS: Lab Results  Component Value Date   TROPONINI 0.24* 01/12/2016   Results for orders placed or performed during the hospital encounter of 01/12/16 (from the past 24 hour(s))  CBC     Status: Abnormal   Collection Time: 01/14/16  2:48 AM  Result Value Ref Range   WBC 7.7 4.0 - 10.5 K/uL   RBC 3.94 3.87 - 5.11 MIL/uL   Hemoglobin 11.8 (L) 12.0 - 15.0 g/dL   HCT 65.737.6 84.636.0 - 96.246.0 %   MCV 95.4 78.0 - 100.0 fL   MCH 29.9 26.0 - 34.0 pg   MCHC 31.4 30.0 - 36.0 g/dL   RDW 95.214.9 84.111.5 - 32.415.5 %   Platelets 130 (L) 150 - 400 K/uL  Heparin level (unfractionated)     Status: Abnormal   Collection Time: 01/14/16  2:48 AM  Result Value Ref Range   Heparin Unfractionated >2.20 (H) 0.30 - 0.70 IU/mL    Intake/Output Summary (Last 24 hours) at 01/14/16 1238 Last data filed at 01/14/16 0800  Gross per 24 hour  Intake      0 ml  Output    350 ml  Net   -350 ml    Telemetry: Sinus rhythm.  PVCs.   ASSESSMENT AND PLAN:  Active Problems:   CORONARY ATHEROSCLEROSIS, NATIVE VESSEL   COPD (chronic obstructive pulmonary disease) (HCC)   GERD   Edema   Chest pain   NSTEMI (non-ST elevated myocardial infarction) (HCC)   Hyperlipidemia   # Bilateral PE: # R LE DVT:  # Demand ischemia: Ms. Laural BenesJohnson has extensive R LE DVT  and bilateral PEs.  She was already started on heparin 2 days ago.  It seems that the troponin elevation is related to her PE and demand ischemia rather than ACS.  She is now on Xarelto 15mg  bid.  It is likely that PTE occurred due to immobility after her rib fracture.  Echo is reviewed and reveals reduced RV function.  Will follow closely.    # Hypertension: Stable No change required today  # COPD: Continue home nebs and inhalers.  Hillis RangeJames Jackelin Correia MD, [facility]FACC 01/14/2016 12:38 PM   Addendum  Lost discussion with granddaughter about prognosis which is guarded currently but hopefully will improve. Pt is very clear that she does not wish for intubation in the future but would be ok with limited code otherwise.  Hillis RangeJames Arayah Krouse MD, [facility] HospitalFACC 01/14/2016 12:38 PM

## 2016-01-14 NOTE — Progress Notes (Signed)
   PATIENT ID: [id number] with CAD s/p BMS to RCA 1995, hypertension, hyperlipidemia and COPD here with chest pain.  Complicated by recent fall and left rib fracture.  Found to have PTE.  SUBJECTIVE: SOB is stable.  + chest pain.   PHYSICAL EXAM Filed Vitals:   01/14/16 0817 01/14/16 0818 01/14/16 0857 01/14/16 1140  BP:   150/91   Pulse:   101   Temp:      TempSrc:      Resp:      Height:      Weight:      SpO2: 98% 97%  97%   General: Chronically ill-appearing HEENT:  scle clear, conj pink, OP clear Neck: JVP 1 cm above clavicle at 45 degrees Lungs:  Diffuse expiratory wheezeing  Heart:  RRR.  No m/r/g.  Abdomen:  Soft, NT, ND.  +BS Extremities:    No edema  LABS: Lab Results  Component Value Date   TROPONINI 0.24* 01/12/2016   Results for orders placed or performed during the hospital encounter of 01/12/16 (from the past 24 hour(s))  CBC     Status: Abnormal   Collection Time: 01/14/16  2:48 AM  Result Value Ref Range   WBC 7.7 4.0 - 10.5 K/uL   RBC 3.94 3.87 - 5.11 MIL/uL   Hemoglobin 11.8 (L) 12.0 - 15.0 g/dL   HCT 16.137.6 09.636.0 - 04.546.0 %   MCV 95.4 78.0 - 100.0 fL   MCH 29.9 26.0 - 34.0 pg   MCHC 31.4 30.0 - 36.0 g/dL   RDW 40.914.9 81.111.5 - 91.415.5 %   Platelets 130 (L) 150 - 400 K/uL  Heparin level (unfractionated)     Status: Abnormal   Collection Time: 01/14/16  2:48 AM  Result Value Ref Range   Heparin Unfractionated >2.20 (H) 0.30 - 0.70 IU/mL    Intake/Output Summary (Last 24 hours) at 01/14/16 1219 Last data filed at 01/14/16 0800  Gross per 24 hour  Intake      0 ml  Output    350 ml  Net   -350 ml    Telemetry: Sinus rhythm.  PVCs.   ASSESSMENT AND PLAN:  Active Problems:   CORONARY ATHEROSCLEROSIS, NATIVE VESSEL   COPD (chronic obstructive pulmonary disease) (HCC)   GERD   Edema   Chest pain   NSTEMI (non-ST elevated myocardial infarction) (HCC)   Hyperlipidemia   # Bilateral PE: # R LE DVT:  # Demand ischemia: Ms. Laural BenesJohnson has extensive R LE DVT  and bilateral PEs.  She was already started on heparin 2 days ago.  It seems that the troponin elevation is related to her PE and demand ischemia rather than ACS.  She is now on Xarelto 15mg  bid.  It is likely that PTE occurred due to immobility after her rib fracture.  Echo is reviewed and reveals reduced RV function.  Will follow closely.    # Hypertension: Stable No change required today  # COPD: Continue home nebs and inhalers.  Hillis RangeJames Gennifer Potenza MD, [facility] SystemFACC 01/14/2016 12:21 PM

## 2016-01-15 DIAGNOSIS — I82409 Acute embolism and thrombosis of unspecified deep veins of unspecified lower extremity: Secondary | ICD-10-CM

## 2016-01-15 NOTE — Progress Notes (Signed)
   PATIENT ID: [id number] with CAD s/p BMS to RCA 1995, hypertension, hyperlipidemia and COPD here with chest pain.  Complicated by recent fall and left rib fracture.  Found to have PTE.  SUBJECTIVE: SOB is stable.  + chest pain.  + rib pain   PHYSICAL EXAM Filed Vitals:   01/15/16 0700 01/15/16 0716 01/15/16 0719 01/15/16 1113  BP: 159/77     Pulse: 83     Temp: 97.9 F (36.6 C)     TempSrc: Oral     Resp: 21     Height:      Weight:      SpO2: 97% 94% 93% 97%   General: Chronically ill-appearing HEENT:  scle clear, conj pink, OP clear Neck: JVP 1 cm above clavicle at 45 degrees Lungs:  Diffuse expiratory wheezeing  Heart:  RRR.  No m/r/g.  Abdomen:  Soft, NT, ND.  +BS Extremities:    No edema  LABS: Lab Results  Component Value Date   TROPONINI 0.24* 01/12/2016   No results found for this or any previous visit (from the past 24 hour(s)).  Intake/Output Summary (Last 24 hours) at 01/15/16 1156 Last data filed at 01/15/16 0900  Gross per 24 hour  Intake    720 ml  Output    275 ml  Net    445 ml    Telemetry: Sinus rhythm.  PVCs.   ASSESSMENT AND PLAN:  Active Problems:   CORONARY ATHEROSCLEROSIS, NATIVE VESSEL   COPD (chronic obstructive pulmonary disease) (HCC)   GERD   Edema   Chest pain   NSTEMI (non-ST elevated myocardial infarction) (HCC)   Hyperlipidemia   # Bilateral PE: # R LE DVT:  # Demand ischemia: [name] has extensive R LE DVT and bilateral PEs.  She was already started on heparin 3 days ago.  It seems that the troponin elevation is related to her PE and demand ischemia rather than ACS.  She is now on Xarelto 15mg  bid.  It is likely that PTE occurred due to immobility after her rib fracture.  Echo is reviewed and reveals reduced RV function.  Will follow closely.    # Hypertension: Stable No change required today  # COPD: Continue home nebs and inhalers.  Transfer to telemetry today PT to see in am  Hillis RangeJames [name] MD, [facility] HospitalFACC 01/15/2016 11:56  AM

## 2016-01-16 MED ORDER — HYDROCODONE-ACETAMINOPHEN 5-325 MG PO TABS
1.0000 | ORAL_TABLET | ORAL | Status: DC | PRN
  Administered 2016-01-16 – 2016-01-17 (×4): 1 via ORAL
  Filled 2016-01-16 (×4): qty 1

## 2016-01-16 NOTE — Progress Notes (Signed)
     SUBJECTIVE: Mild chest pain with inspiration. Baseline dyspnea.   Tele: sinus  BP 149/78 mmHg  Pulse 82  Temp(Src) 98 F (36.7 C) (Oral)  Resp 25  Ht 5\' 7"  (1.702 m)  Wt 130 lb (58.968 kg)  BMI 20.36 kg/m2  SpO2 100%  Intake/Output Summary (Last 24 hours) at 01/16/16 0825 Last data filed at 01/16/16 0500  Gross per 24 hour  Intake    480 ml  Output      0 ml  Net    480 ml    PHYSICAL EXAM General: Well developed, well nourished, in no acute distress. Alert and oriented x 3.  Psych:  Good affect, responds appropriately Neck: No JVD. No masses noted.  Lungs: Clear bilaterally with no wheezes or rhonci noted.  Heart: RRR with no murmurs noted. Abdomen: Bowel sounds are present. Soft, non-tender.  Extremities: No lower extremity edema.   LABS: CBC:  Recent Labs  01/14/16 0248  WBC 7.7  HGB 11.8*  HCT 37.6  MCV 95.4  PLT 130*   Current Meds: . acetaminophen  650 mg Oral QID  . albuterol  2.5 mg Nebulization QID  . antiseptic oral rinse  7 mL Mouth Rinse BID  . atorvastatin  80 mg Oral q1800  . docusate sodium  100 mg Oral Daily  . gabapentin  400 mg Oral TID  . isosorbide mononitrate  60 mg Oral Daily  . lidocaine  1 patch Transdermal Q24H  . methimazole  5 mg Oral BID  . metoprolol tartrate  12.5 mg Oral BID  . mometasone-formoterol  2 puff Inhalation BID  . pantoprazole  40 mg Oral Q1200  . Rivaroxaban  15 mg Oral BID WC  . [START ON 02/04/2016] rivaroxaban  20 mg Oral Q supper    ASSESSMENT AND PLAN:  1. Acute bilateral pulmonary embolism, right lower extremity DVT: She was recently immobile after fall and rib fractures. She is now on Xarelto 15 mg po BID with plans for 3 total weeks at this dose, then change to 20 mg daily.   2. Demand ischemia: Elevated troponin in setting of bilateral PE. No plans for further ischemic workup.   3. HTN: Stable.   4. COPD: Continue home nebs and inhalers.  PT consult today. Probable home 24-48 hours.    Verne CarrowChristopher Lamaj Metoyer  7/3/20178:25 AM

## 2016-01-16 NOTE — Care Management Note (Signed)
Case Management Note  Patient Details  Name: BRADY PLANT MRN: [id number] Date of Birth: February 11, 1946  Subjective/Objective:        CM following for progression and d/c planning.            Action/Plan: 01/16/2016 Met with pt and family member, discussed Xarelto and 30 day free card given to family member who states that she will give the card to the pt granddaughter with whom the pt lives . The Granddaughter manages medications etc.   Expected Discharge Date:                  Expected Discharge Plan:  [place]  In-House Referral:  NA  Discharge planning Services  CM Consult, Medication Assistance  Post Acute Care Choice:  Home Health Choice offered to:  Adult Children  DME Arranged:    DME Agency:     HH Arranged:  RN, PT [place] Agency:     Status of Service:  In process, will continue to follow  If discussed at Long Length of Stay Meetings, dates discussed:    Additional Comments:  Adron Bene, RN 01/16/2016, 4:09 PM

## 2016-01-16 NOTE — Care Management Important Message (Signed)
Important Message  Patient Details  Name: [name] MRN: [id number] Date of Birth: 07/23/1945   Medicare Important Message Given:  Yes    [name], [name] 01/16/2016, 11:18 AM

## 2016-01-16 NOTE — Evaluation (Signed)
Physical Therapy Evaluation Patient Details Name: [name] MRN: [id number] DOB: 1946-02-17 Today's Date: 01/16/2016   History of Present Illness  28F with CAD s/p BMS to RCA 1995, hypertension, hyperlipidemia and COPD here with chest pain. Complicated by recent fall and left rib fracture. + for DVTs.  Clinical Impression  Patient demonstrates deficits in functional mobility as indicated below. Will need continued skilled PT to address deficits and maximize function. Will see as indicated and progress as tolerated. Recommend HHPT upon acute discharge.    Follow Up Recommendations Home health PT;Supervision for mobility/OOB    Equipment Recommendations  None recommended by PT    Recommendations for Other Services       Precautions / Restrictions Precautions Precaution Comments: watch O2 saturations Restrictions Weight Bearing Restrictions: No      Mobility  Bed Mobility Overal bed mobility: Needs Assistance Bed Mobility: Rolling;Sidelying to Sit Rolling: Min guard Sidelying to sit: Min assist       General bed mobility comments: min assist to power to upright with HOB elevated, increased time to perform  Transfers Overall transfer level: Needs assistance Equipment used: 1 person hand held assist Transfers: Sit to/from Stand Sit to Stand: Min assist         General transfer comment: min assist to stand from toilet, min guard from bed.   Ambulation/Gait Ambulation/Gait assistance: Min guard Ambulation Distance (Feet): 24 Feet Assistive device: 1 person hand held assist Gait Pattern/deviations: Step-through pattern;Decreased stride length;Drifts right/left;Narrow base of support Gait velocity: decreased Gait velocity interpretation: Below normal speed for age/gender General Gait Details: slow gait, modest instability. desaturation with in room ambulation to 86%, improved with rest and cues for pursed lip breathing on 3 liters [place].   Stairs             Wheelchair Mobility    Modified Rankin (Stroke Patients Only)       Balance Overall balance assessment: History of Falls                                           Pertinent Vitals/Pain Pain Assessment: No/denies pain    Home Living Family/patient expects to be discharged to:: Private residence Living Arrangements: Other relatives (granddaughter [name]) Available Help at Discharge: Family Type of Home: House Home Access: Stairs to enter Entrance Stairs-Rails: Can reach both Entrance Stairs-Number of Steps: 5 Home Layout: Multi-level;Able to live on main level with bedroom/bathroom Home Equipment: Dan HumphreysWalker - 2 wheels;Walker - 4 wheels;Cane - single point;Shower seat      Prior Function Level of Independence: Independent with assistive device(s)         Comments: ocassional use of cane or walker     Hand Dominance   Dominant Hand: Right    Extremity/Trunk Assessment   Upper Extremity Assessment: Generalized weakness           Lower Extremity Assessment: Generalized weakness         Communication   Communication: HOH  Cognition Arousal/Alertness: Awake/alert Behavior During Therapy: WFL for tasks assessed/performed Overall Cognitive Status: Within Functional Limits for tasks assessed                      General Comments      Exercises        Assessment/Plan    PT Assessment Patient needs continued PT services  PT Diagnosis Difficulty walking;Abnormality  of gait;Generalized weakness   PT Problem List Decreased strength;Decreased activity tolerance;Decreased balance;Decreased mobility;Cardiopulmonary status limiting activity  PT Treatment Interventions DME instruction;Gait training;Stair training;Functional mobility training;Therapeutic activities;Therapeutic exercise;Balance training;Patient/family education   PT Goals (Current goals can be found in the Care Plan section) Acute Rehab PT Goals Patient Stated Goal: to  go home to granddaughters PT Goal Formulation: With patient/family Time For Goal Achievement: 01/30/16 Potential to Achieve Goals: Good    Frequency Min 3X/week   Barriers to discharge        Co-evaluation               End of Session Equipment Utilized During Treatment: Gait belt;Oxygen Activity Tolerance: Patient limited by fatigue Patient left: in chair;with call bell/phone within reach;with chair alarm set;with family/visitor present Nurse Communication: Mobility status         Time: [id number] PT Time Calculation (min) (ACUTE ONLY): 22 min   Charges:   PT Evaluation $PT Eval Moderate Complexity: 1 Procedure     PT G CodesFabio Asa:        Hobart Marte J 01/16/2016, 1:48 PM Charlotte Crumbevon Natalin Bible, PT DPT  [phone]

## 2016-01-17 ENCOUNTER — Encounter (HOSPITAL_COMMUNITY): Payer: Self-pay | Admitting: Nurse Practitioner

## 2016-01-17 DIAGNOSIS — R7989 Other specified abnormal findings of blood chemistry: Secondary | ICD-10-CM

## 2016-01-17 DIAGNOSIS — J449 Chronic obstructive pulmonary disease, unspecified: Secondary | ICD-10-CM

## 2016-01-17 DIAGNOSIS — I82409 Acute embolism and thrombosis of unspecified deep veins of unspecified lower extremity: Secondary | ICD-10-CM

## 2016-01-17 DIAGNOSIS — R778 Other specified abnormalities of plasma proteins: Secondary | ICD-10-CM

## 2016-01-17 DIAGNOSIS — I2699 Other pulmonary embolism without acute cor pulmonale: Secondary | ICD-10-CM | POA: Insufficient documentation

## 2016-01-17 DIAGNOSIS — S2239XA Fracture of one rib, unspecified side, initial encounter for closed fracture: Secondary | ICD-10-CM

## 2016-01-17 LAB — BASIC METABOLIC PANEL
Anion gap: 7 (ref 5–15)
BUN: 9 mg/dL (ref 6–20)
CALCIUM: 8.9 mg/dL (ref 8.9–10.3)
CHLORIDE: 105 mmol/L (ref 101–111)
CO2: 27 mmol/L (ref 22–32)
CREATININE: 0.73 mg/dL (ref 0.44–1.00)
GFR calc non Af Amer: >60 mL/min (ref >60)
GLUCOSE: 124 mg/dL — AB (ref 65–99)
Potassium: 5.5 mmol/L — ABNORMAL HIGH (ref 3.5–5.1)
Sodium: 139 mmol/L (ref 135–145)

## 2016-01-17 LAB — CBC
HCT: 41.4 % (ref 36.0–46.0)
HEMOGLOBIN: 13.1 g/dL (ref 12.0–15.0)
MCH: 30.3 pg (ref 26.0–34.0)
MCHC: 31.6 g/dL (ref 30.0–36.0)
MCV: 95.8 fL (ref 78.0–100.0)
PLATELETS: 186 10*3/uL (ref 150–400)
RBC: 4.32 MIL/uL (ref 3.87–5.11)
RDW: 15.1 % (ref 11.5–15.5)
WBC: 9.5 10*3/uL (ref 4.0–10.5)

## 2016-01-17 MED ORDER — LIDOCAINE 5 % EX PTCH: 1.0000 | MEDICATED_PATCH | CUTANEOUS | Status: AC

## 2016-01-17 MED ORDER — RIVAROXABAN 20 MG PO TABS: 20.0000 mg | ORAL_TABLET | Freq: Every day | ORAL | Status: AC

## 2016-01-17 MED ORDER — TRAMADOL HCL 50 MG PO TABS: 50.0000 mg | ORAL_TABLET | Freq: Four times a day (QID) | ORAL | Status: AC | PRN

## 2016-01-17 MED ORDER — RIVAROXABAN 15 MG PO TABS: 15.0000 mg | ORAL_TABLET | Freq: Two times a day (BID) | ORAL | Status: AC

## 2016-01-17 MED ORDER — METOPROLOL TARTRATE 25 MG PO TABS: 12.5000 mg | ORAL_TABLET | Freq: Two times a day (BID) | ORAL | Status: AC

## 2016-01-17 NOTE — Care Management Note (Addendum)
Case Management Note  Patient Details  Name: Clayborne Danamma J Klaiber MRN: [id number] Date of Birth: [birth year]  Subjective/Objective:   Bilateral PE                Action/Plan: Discharge Planning: AVS reviewed:  Please see previous NCM notes. Pt has oxygen home. Contacted [facility] for 3n1 bedside commode for home. [facility] are requesting [facility] faxed due to office closed today for holiday. Faxed HH PT, RN orders, facesheet and dc summary. Contacted [facility] and they do have Xarelto in stock.    PCP- Mickey FarberKristen Cox MD  Expected Discharge Date:  01/17/2016              Expected Discharge Plan:  Home w Home Health Services  In-House Referral:  NA  Discharge planning Services  CM Consult, Medication Assistance  Post Acute Care Choice:  Home Health, Resumption of Svcs/PTA Provider Choice offered to:  Adult Children  DME Arranged:  3-N-1 DME Agency:  [facility]  HH Arranged:  RN, PT HH [facility]  Status of Service:  Completed, signed off  If discussed at Long Length of Stay Meetings, dates discussed:    Additional Comments:  Elliot CousinShavis, Antonette Hendricks Ellen, RN 01/17/2016, 11:08 AM

## 2016-01-17 NOTE — Discharge Summary (Signed)
Discharge Summary    Patient ID: [name],  MRN: [id number], DOB/AGE: 1946-02-17 70 y.o.  Admit date: 01/12/2016 Discharge date: 01/17/2016  Primary Care Provider: Blane Ohara Primary Cardiologist: C. Clifton James, MD   Discharge Diagnoses    Principal Problem:   Acute pulmonary embolus (HCC)  **Bilat PE involving all lobes  Xarelto started.  Active Problems:   Chest pain   Acute DVT (deep venous thrombosis) (HCC)  **Right Popliteal and distal Femoral Vein DVT's.   CORONARY ATHEROSCLEROSIS, NATIVE VESSEL   COPD (chronic obstructive pulmonary disease) (HCC)   GERD   Elevated troponin   COSTOCHONDRITIS   Edema   Hyperlipidemia   Rib fracture  Allergies Allergies  Allergen Reactions  . Avelox [Moxifloxacin Hcl In Nacl] Anaphylaxis  . Daliresp [Roflumilast] Other (See Comments)    tremors  . Fosamax [Alendronate Sodium] Nausea And Vomiting  . Lexapro [Escitalopram Oxalate] Diarrhea  . Contrast Media [Iodinated Diagnostic Agents] Rash    Verified with patient, she has not experienced shortness of breath with contrast. She had a mild site reaction at her IV during that time.    Diagnostic Studies/Procedures    Lexiscan Cardiolite - Resting images only 6.29.2017  IMPRESSION: 1. Large lateral wall myocardial perfusion defect seen on resting images only. 2. No stress imaging or quantitative gated study performed. _____________  CT Angio of the Chest with Contrast  6.29.2017  IMPRESSION: 1. Bilateral pulmonary emboli are identified involving all lobes with thrombus extending into the right greater than left pulmonary arteries. 2. Emphysema. 3. Goiter. _____________   Lower Extremity Venous Doppler 6.29.2017  There is acute, mobile deep vein thrombus noted in the right proximal popliteal vein.  There is acute, occlusive DVT noted in the right distal femoral vein.  _____________  2D Echocardiogram 6.30.2017  Study Conclusions   - Left ventricle: The  cavity size was normal. Wall thickness was   normal. Systolic function was normal. The estimated ejection   fraction was in the range of 60% to 65%. Wall motion was normal;   there were no regional wall motion abnormalities. Doppler   parameters are consistent with abnormal left ventricular   relaxation (grade 1 diastolic dysfunction). - Right ventricle: Systolic function was moderately reduced. - Pulmonary arteries: Systolic pressure was moderately increased.   PA peak pressure: 60 mm Hg (S). _____________    History of Present Illness     70 year old female with prior history of CAD, hypertension, hyperlipidemia, COPD on chronic home O2, and GERD. She is status post prior RCA stenting in 1995 with nonobstructive CAD by catheterization in 2007. She had a low risk stress test in May 2016. More recently, she was hospitalized at [facility] in May 2017 with COPD and UTI. She was hospitalized in early June 2017 with COPD and again in late June following a fall with left-sided rib fractures. Approximate 2 days prior to admission, she began to experience chest pain and worsening of shortness of breath. The pain was worse and different than what she had experienced with her rib fracture. She was found to be hypoxic at home on the day of admission with oxygen saturations in the mid 80s. She was taken to the [facility] ED where ECG was unchanged. Troponin was mildly elevated at 0.08. She continued to have chest pain and was transferred to [facility] for further evaluation.  Hospital Course     Consultants: None   Following admission, she continued to have chest pain and dyspnea and troponin  rose to 0.24. She was taken for a Lexiscan Myoview on June 29 but was only able to complete resting images secondary to severe chest pain. In the setting of recent debility, ongoing chest pain, and dyspnea, lower extremity ultrasound was undertaken and revealed an acute mobile DVT in the right proximal  popliteal vein as well as acute, occlusive DVT in the right distal femoral vein. In that setting, suspicion for pulmonary embolism was high and a CT angiogram of the chest was performed and revealed bilateral pulmonary emboli identified involving all lobes with thrombus extending to the right greater and left pulmonary arteries. She was initially placed on heparin and subsequently transitioned to oral xarelto 15 mg twice a day with the plan to continue that dose for 21 days followed by 20 mg daily for 3 months. 2-D echocardiogram was performed and showed normal LV function with grade 1 diastolic dysfunction. Right ventricular function was moderately reduced. It was felt that mild troponin elevation, and all symptoms, are secondary to pulmonary embolism, and we decided not to pursue additional ischemic evaluation. Though she completed rest imaging for her Myoview, which showed prior lateral infarct, we did not pursue stress imaging.  Mrs. Talwar's dyspnea has been stable and at her baseline. She will be discharged home today in good condition. She will follow-up in our office within the next 10 days. _____________  Discharge Vitals Blood pressure 139/85, pulse 89, temperature 98.1 F (36.7 C), temperature source Oral, resp. rate 19, height 5\' 7"  (1.702 m), weight 129 lb 4.8 oz (58.65 kg), SpO2 96 %.  Filed Weights   01/14/16 0530 01/16/16 0500 01/17/16 0534  Weight: 131 lb 6.3 oz (59.6 kg) 130 lb (58.968 kg) 129 lb 4.8 oz (58.65 kg)    Labs & Radiologic Studies    CBC  Recent Labs  01/17/16 0356  WBC 9.5  HGB 13.1  HCT 41.4  MCV 95.8  PLT 186   Basic Metabolic Panel  Recent Labs  01/17/16 0356  NA 139  K 5.5*  CL 105  CO2 27  GLUCOSE 124*  BUN 9  CREATININE 0.73  CALCIUM 8.9   Cardiac Enzymes Lab Results  Component Value Date   TROPONINI 0.24* 01/12/2016    _____________  Ct Chest W Contrast  01/12/2016  CLINICAL DATA:  Chest pain and shortness of breath. EXAM: CT CHEST  WITH CONTRAST TECHNIQUE: Multidetector CT imaging of the chest was performed during intravenous contrast administration. CONTRAST:  75 cc of Isovue 370 COMPARISON:  Chest x-ray Dec 05, 2015 FINDINGS: Mucus is seen within the trachea. Central airways are otherwise normal. No pneumothorax. Emphysematous changes are seen in the lungs, particularly in the apices. There is atelectasis in the bases and right middle lobe. No suspicious pulmonary nodules, masses, or focal infiltrates. 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 Numerous nodules are seen in the thyroid. A chunky calcification is seen in the right side of the thyroid. No adenopathy. Bilateral pulmonary emboli are identified with involvement of all lobes and thrombus extending into the right greater than left main pulmonary arteries. The thoracic aorta is atherosclerotic but non aneurysmal. No dissection. Coronary artery calcifications are noted. The heart size is normal. Small bilateral effusions and atelectasis are seen. Evaluation of the upper abdomen is limited. There is atherosclerotic change in the abdominal aorta. No other acute abnormalities. Mild degenerative changes in the spine. IMPRESSION: 1. Bilateral pulmonary emboli are identified involving all lobes with thrombus extending into the right greater than left pulmonary  arteries. 2. Emphysema. 3. Goiter. Findings discussed with the patient's nurse, [facility]. She is relaying the findings to the attending physician. Electronically Signed   By: Gerome Sam III M.D   On: 01/12/2016 21:14   Nm Myocar Single W/spect W/wall Motion And Ef  01/14/2016  CLINICAL DATA:  Chest pain and dyspnea. Coronary artery disease and previous myocardial infarct. EXAM: MYOCARDIAL IMAGING WITH SPECT (REST) TECHNIQUE: Standard myocardial SPECT imaging was performed after resting intravenous injection of 10 Tc-57m tetrofosmin. COMPARISON:  None. FINDINGS: Perfusion: A large myocardial perfusion defect is seen in  the lateral left ventricular wall on resting images. No stress imaging or quantitative gated study performed. IMPRESSION: 1. Large lateral wall myocardial perfusion defect seen on resting images only. 2. No stress imaging or quantitative gated study performed. Electronically Signed   By: Myles Rosenthal M.D.   On: 01/14/2016 10:24   Disposition   Pt is being discharged home today in good condition.  Follow-up Plans & Appointments    Follow-up Information    Follow up with Verne Carrow, MD On 03/30/2016.   Specialty:  Cardiology   Why:  9:30 AM   Contact information:   [address] [place] [address] [phone]       Follow up with Verne Carrow, MD In 1 week.   Specialty:  Cardiology   Why:  we will arrange for follow-up and contact you.   Contact information:   [address] [place] [address] [phone]       Follow up with [facility].   Why:  Home Health RN , Physical Therapy   Contact information:   [address] [place] [address] [phone]        Discharge Medications   Current Discharge Medication List    START taking these medications   Details  lidocaine (LIDODERM) 5 % Place 1 patch onto the skin daily. Remove & Discard patch within 12 hours or as directed by MD Qty: 30 patch, Refills: 1    !! Rivaroxaban (XARELTO) 15 MG TABS tablet Take 1 tablet (15 mg total) by mouth 2 (two) times daily with a meal. x 18 more days then start 20 mg daily on 7/22 (Rx provided). Qty: 35 tablet, Refills: 0    !! rivaroxaban (XARELTO) 20 MG TABS tablet Take 1 tablet (20 mg total) by mouth daily with supper. Qty: 30 tablet, Refills: 3    traMADol (ULTRAM) 50 MG tablet Take 1 tablet (50 mg total) by mouth every 6 (six) hours as needed for moderate pain. Qty: 30 tablet, Refills: 0     !! - Potential duplicate medications found. Please discuss with provider.    CONTINUE these medications which have  CHANGED   Details  metoprolol tartrate (LOPRESSOR) 25 MG tablet Take 0.5 tablets (12.5 mg total) by mouth 2 (two) times daily. Qty: 30 tablet, Refills: 6      CONTINUE these medications which have NOT CHANGED   Details  acetaminophen (TYLENOL) 325 MG tablet Take 650 mg by mouth every 6 (six) hours.    albuterol (PROVENTIL) (2.5 MG/3ML) 0.083% nebulizer solution Take 2.5 mg by nebulization 4 (four) times daily.     atorvastatin (LIPITOR) 80 MG tablet TAKE 1 TABLET BY MOUTH EVERY DAY Qty: 30 tablet, Refills: 11    budesonide-formoterol (SYMBICORT) 80-4.5 MCG/ACT inhaler Inhale 2 puffs into the lungs 2 (two) times daily.    [employer] FIBER GUMMIES PO Take 1 tablet  by mouth daily.    cyclobenzaprine (FLEXERIL) 10 MG tablet Take 10 mg by mouth 2 (two) times daily as needed for muscle spasms.    Docusate Calcium (STOOL SOFTENER PO) Take 1 tablet by mouth daily.    furosemide (LASIX) 20 MG tablet Take 1 tablet (20 mg total) by mouth daily as needed. Qty: 90 tablet, Refills: 3    gabapentin (NEURONTIN) 400 MG capsule Take 400 mg by mouth 3 (three) times daily.     isosorbide mononitrate (IMDUR) 120 MG 24 hr tablet TAKE 1 TABLET BY MOUTH EVERY DAY Qty: 30 tablet, Refills: 11    metFORMIN (GLUCOPHAGE) 500 MG tablet TAKE 1 DAILY IN THE MORNING Refills: 0    methimazole (TAPAZOLE) 5 MG tablet Take 5 mg by mouth 2 (two) times daily. Refills: 0    nicotine (NICODERM CQ - DOSED IN MG/24 HOURS) 21 mg/24hr patch Place 21 mg onto the skin daily.    nitroGLYCERIN (NITROSTAT) 0.4 MG SL tablet Place 0.4 mg under the tongue every 5 (five) minutes as needed for chest pain (X3 DOSES BEFORE CALLING 911).    NON FORMULARY Place 1 L/min into the nose continuous. OXYGEN ALL DAY    nystatin ointment (MYCOSTATIN) Apply 1 application topically 2 (two) times daily.    omeprazole (PRILOSEC) 40 MG capsule Take 40 mg by mouth daily. Refills: 3    ondansetron (ZOFRAN) 4 MG tablet Take 4 mg by mouth every 6  (six) hours as needed for nausea or vomiting.    oxyCODONE (OXY IR/ROXICODONE) 5 MG immediate release tablet Take 510 mg by mouth every 6 (six) hours.    VENTOLIN HFA 108 (90 Base) MCG/ACT inhaler Inhale 1-2 puffs into the lungs every 4 (four) hours as needed for wheezing or shortness of breath.       STOP taking these medications     Fluconazole (DIFLUCAN PO)           Outstanding Labs/Studies   Follow up CBC/BMET @ return office visit.  Duration of Discharge Encounter   Greater than 30 minutes including physician time.  Signed, Nicolasa Duckinghristopher Amair Shrout NP 01/17/2016, 11:46 AM

## 2016-01-17 NOTE — Progress Notes (Signed)
     SUBJECTIVE: No chest pain. Breathing at baseline.   Tele: sinus  BP 139/85 mmHg  Pulse 89  Temp(Src) 98.1 F (36.7 C) (Oral)  Resp 19  Ht 5\' 7"  (1.702 m)  Wt 129 lb 4.8 oz (58.65 kg)  BMI 20.25 kg/m2  SpO2 96% No intake or output data in the 24 hours ending 01/17/16 0742  PHYSICAL EXAM General: Well developed, well nourished, in no acute distress. Alert and oriented x 3.  Psych:  Good affect, responds appropriately Neck: No JVD. No masses noted.  Lungs: Clear bilaterally with no wheezes or rhonci noted.  Heart: RRR with no murmurs noted. Abdomen: Bowel sounds are present. Soft, non-tender.  Extremities: No lower extremity edema.   LABS: Basic Metabolic Panel:  Recent Labs  03/47/4205/10/30 0356  NA 139  K 5.5*  CL 105  CO2 27  GLUCOSE 124*  BUN 9  CREATININE 0.73  CALCIUM 8.9   CBC:  Recent Labs  01/17/16 0356  WBC 9.5  HGB 13.1  HCT 41.4  MCV 95.8  PLT 186   Current Meds: . acetaminophen  650 mg Oral QID  . albuterol  2.5 mg Nebulization QID  . antiseptic oral rinse  7 mL Mouth Rinse BID  . atorvastatin  80 mg Oral q1800  . docusate sodium  100 mg Oral Daily  . gabapentin  400 mg Oral TID  . isosorbide mononitrate  60 mg Oral Daily  . lidocaine  1 patch Transdermal Q24H  . methimazole  5 mg Oral BID  . metoprolol tartrate  12.5 mg Oral BID  . mometasone-formoterol  2 puff Inhalation BID  . pantoprazole  40 mg Oral Q1200  . Rivaroxaban  15 mg Oral BID WC  . [START ON 02/04/2016] rivaroxaban  20 mg Oral Q supper     ASSESSMENT AND PLAN:  1. Acute bilateral pulmonary embolism, right lower extremity DVT: She was recently immobile after fall and rib fractures. She is now on Xarelto 15 mg po BID with plans for 3 total weeks at this dose, then change to 20 mg daily.   2. Demand ischemia: Elevated troponin in setting of bilateral PE. No plans for further ischemic workup.   3. HTN: Stable.   4. COPD: Continue home nebs and inhalers.  Dispo: Will  d/c home today. Follow up with me on Thursday July 13th. She can be added to an available spot on my morning only schedule.   Verne Carrowhristopher McAlhany  7/4/20177:42 AM

## 2016-01-17 NOTE — Care Management Note (Signed)
Case Management Note  Patient Details  Name: [name] MRN: [id number] Date of Birth: 12-06-1945  Subjective/Objective:     Per CMA:  XARELTO 15 MG BID AND   20 MG  QD   ( 30 )   COVER- YES               YES  CO-PAY- $ 47.00             $47.00  TIER- 3 DRUG               3 DRUG  PRIOR APPROVAL - NO       NO  PH[facility]Y : [id number]                          Expected Discharge Plan:  Home w Home Health Services  Discharge planning Services  CM Consult, Medication Assistance  Post Acute Care Choice:  Home Health, Resumption of Svcs/PTA Provider Choice offered to:  Adult Children  DME Arranged:  3-N-1 DME Agency:  [facility]  HH Arranged:  RN, PT [facility]  Status of Service:  Completed, signed off  Magdalene RiverMayo, Mariea Mcmartin T, [place]RN 01/17/2016, 12:27 PM

## 2016-01-17 NOTE — Plan of Care (Signed)
Problem: Education: Goal: Knowledge of [facility] Education information/materials will improve Outcome: Completed/Met Date Met:  01/17/16 Pt educated throughout entire admission regarding tests, procedures, medications, and available resources  Problem: Safety: Goal: Ability to remain free from injury will improve Outcome: Completed/Met Date Met:  01/17/16 Pt has remained free from injury during this admission   Problem: Activity: Goal: Risk for activity intolerance will decrease Outcome: Progressing Pt still experiencing SHOB with exertion but it is getting better  Problem: Fluid Volume: Goal: Ability to maintain a balanced intake and output will improve Outcome: Completed/Met Date Met:  01/17/16 Pt has adequate intake and output   Problem: Nutrition: Goal: Adequate nutrition will be maintained Outcome: Completed/Met Date Met:  01/17/16 Pt able to tolerate current diet with no difficulties   Problem: Bowel/Gastric: Goal: Will not experience complications related to bowel motility Outcome: Completed/Met Date Met:  01/17/16 Pt has not experienced any complications related to bowel  Motility during this admission   Problem: Consults Goal: Chest Pain Patient Education (See Patient Education module for education specifics.)  Outcome: Completed/Met Date Met:  01/17/16 Pt received education regarding chest pain  Problem: Phase I Progression Outcomes Goal: Anginal pain relieved Outcome: Completed/Met Date Met:  01/17/16 Pt is free from chest pain   Problem: Discharge Progression Outcomes Goal: No anginal pain Outcome: Completed/Met Date Met:  01/17/16 Pt remains free from chest pain  Goal: Discharge plan in place and appropriate Outcome: Completed/Met Date Met:  01/17/16 Pt being discharged home today with granddaughter  Goal: Tolerates diet Outcome: Completed/Met Date Met:  01/17/16 Pt able to tolerate current diet with no difficulties

## 2016-01-17 NOTE — Progress Notes (Signed)
SATURATION QUALIFICATIONS: (This note is used to comply with regulatory documentation for home oxygen)  Patient Saturations on Room Air at Rest = 87%  Patient Saturations on Room Air while Ambulating = did not perform due to desaturations  Patient Saturations on 3 Liters of oxygen while Ambulating = 93%  Patient Saturations on 2 Liters of oxygen while Ambulating = 87%  Patient Saturations on 1 Liters of oxygen while Ambulating = 84% with chest tightness and DOE  Please briefly explain why patient needs home oxygen:Desaturation on room air  [name], PT DPT  [phone]

## 2016-01-17 NOTE — Progress Notes (Signed)
Physical Therapy Treatment Patient Details Name: [name] Rickenbach MRN: [id number] DOB: 11/13/1945 Today's Date: 01/17/2016    History of Present Illness 84F with CAD s/p BMS to RCA 1995, hypertension, hyperlipidemia and COPD here with chest pain. Complicated by recent fall and left rib fracture. + for DVTs.    PT Comments    Patient seen for mobility progression and O2 saturation assessment. Ambulating with use of RW. Will have adequate assist at home. Anticipate patient safe for d/c home.  Follow Up Recommendations  Home health PT;Supervision for mobility/OOB     Equipment Recommendations  None recommended by PT    Recommendations for Other Services       Precautions / Restrictions Precautions Precaution Comments: watch O2 saturations Restrictions Weight Bearing Restrictions: No    Mobility  Bed Mobility Overal bed mobility: Needs Assistance Bed Mobility: Rolling;Sidelying to Sit Rolling: Min guard Sidelying to sit: Min assist       General bed mobility comments: min assist to power to upright with HOB elevated, increased time to perform  Transfers Overall transfer level: Needs assistance Equipment used: 1 person hand held assist Transfers: Sit to/from Stand Sit to Stand: Min assist         General transfer comment: min assist for stability to come to standing  Ambulation/Gait Ambulation/Gait assistance: Min guard Ambulation Distance (Feet): 160 Feet Assistive device: Rolling walker (2 wheeled) Gait Pattern/deviations: Step-through pattern;Decreased stride length;Drifts right/left;Narrow base of support Gait velocity: decreased   General Gait Details: ambulated with RW, O2 saturations assessment performed, see O2 note   Stairs            Wheelchair Mobility    Modified Rankin (Stroke Patients Only)       Balance Overall balance assessment: History of Falls                                  Cognition Arousal/Alertness:  Awake/alert Behavior During Therapy: WFL for tasks assessed/performed Overall Cognitive Status: Within Functional Limits for tasks assessed                      Exercises      General Comments        Pertinent Vitals/Pain Pain Assessment: No/denies pain    Home Living                      Prior Function            PT Goals (current goals can now be found in the care plan section) Acute Rehab PT Goals Patient Stated Goal: to go home to granddaughters PT Goal Formulation: With patient/family Time For Goal Achievement: 01/30/16 Potential to Achieve Goals: Good Progress towards PT goals: Progressing toward goals    Frequency  Min 3X/week    PT Plan Current plan remains appropriate    Co-evaluation             End of Session Equipment Utilized During Treatment: Gait belt;Oxygen Activity Tolerance: Patient limited by fatigue Patient left: in bed;with call bell/phone within reach;with family/visitor present (sitting EOB)     Time: [id number] PT Time Calculation (min) (ACUTE ONLY): 21 min  Charges:  $Gait Training: 8-22 mins                    G CodesFabio Asa:      Keisa Blow Gilbert 01/17/2016, 10:20 AM Charlotte Crumbevon Kalia Vahey, PT  DPT  [phone]

## 2016-01-18 ENCOUNTER — Telehealth: Payer: Self-pay

## 2016-01-18 ENCOUNTER — Telehealth: Payer: Self-pay | Admitting: Cardiovascular Disease

## 2016-01-18 DIAGNOSIS — I10 Essential (primary) hypertension: Secondary | ICD-10-CM | POA: Diagnosis not present

## 2016-01-18 DIAGNOSIS — M1991 Primary osteoarthritis, unspecified site: Secondary | ICD-10-CM | POA: Diagnosis not present

## 2016-01-18 DIAGNOSIS — F0390 Unspecified dementia without behavioral disturbance: Secondary | ICD-10-CM | POA: Diagnosis not present

## 2016-01-18 DIAGNOSIS — J441 Chronic obstructive pulmonary disease with (acute) exacerbation: Secondary | ICD-10-CM | POA: Diagnosis not present

## 2016-01-18 DIAGNOSIS — I2510 Atherosclerotic heart disease of native coronary artery without angina pectoris: Secondary | ICD-10-CM | POA: Diagnosis not present

## 2016-01-18 DIAGNOSIS — E119 Type 2 diabetes mellitus without complications: Secondary | ICD-10-CM | POA: Diagnosis not present

## 2016-01-18 NOTE — Telephone Encounter (Signed)
New message  Pt daughter call requesting to speak with RN. Pt daughter states pt was told by Dr Clifton JamesMcAlhany to follow up with provider only in one week. Please call back to discuss

## 2016-01-18 NOTE — Telephone Encounter (Signed)
I spoke with pt's granddaughter, Tommas Olpngel Campbell. Lawanna Kobusngel states she spoke with Dr Clifton JamesMcAlhany yesterday at the hospital and was told Dr Clifton JamesMcAlhany said he would see the patient next Thursday 01/26/16.  Lawanna Kobusngel advised I will forward to Dr Clifton JamesMcAlhany to confirm pt should be added to his schedule 01/26/16.

## 2016-01-18 NOTE — Telephone Encounter (Signed)
Rib Fractures - left 4th, 5th, 6th ribs - Dx 6/23 @ outside hospital.

## 2016-01-18 NOTE — Telephone Encounter (Signed)
Prior auth for Lidocaine patches 5% [facility].

## 2016-01-18 NOTE — Telephone Encounter (Signed)
I am needing to do a PA for the Lidocaine patches. What is the diagnosis please?

## 2016-01-18 NOTE — Telephone Encounter (Signed)
Thanks much 

## 2016-01-19 MED ORDER — OXYCODONE HCL 5 MG PO TABS: 5.0000 mg | ORAL_TABLET | Freq: Four times a day (QID) | ORAL | Status: AC | PRN

## 2016-01-19 NOTE — Telephone Encounter (Signed)
Will ask Dr Clifton JamesMcAlhany 1) If he wants to refill Oxycodone  2) If he wants her added onto his schedule on 7/13.

## 2016-01-19 NOTE — Telephone Encounter (Signed)
I am ok filling oxycodone for her but she will have to come by the office to pick this up. I can come over today to sign it if someone can print it out for me. I would like to add her onto my schedule 01/26/16. I am there for  Quarter day but she can added after the last patient or in any openings during the morning schedule. Thanks, chris

## 2016-01-19 NOTE — Telephone Encounter (Signed)
Daughter Marylene Landngela is aware.

## 2016-01-19 NOTE — Telephone Encounter (Signed)
°  Pt granddaughter is calling for a prescription for her grandmother pain due to her ribs being fractured/broken  Pt only has 3 Oxycodones 5mg    When she was released by Dr.McAlhany from the hospital

## 2016-01-19 NOTE — Telephone Encounter (Signed)
Thank you! I will print the RX and have added her to your schedule 01/26/16 10:15 am.

## 2016-01-19 NOTE — Telephone Encounter (Signed)
Thanks. I will come over around lunchtime to sign the script.

## 2016-01-20 DIAGNOSIS — F0390 Unspecified dementia without behavioral disturbance: Secondary | ICD-10-CM | POA: Diagnosis not present

## 2016-01-20 DIAGNOSIS — M1991 Primary osteoarthritis, unspecified site: Secondary | ICD-10-CM | POA: Diagnosis not present

## 2016-01-20 DIAGNOSIS — I2510 Atherosclerotic heart disease of native coronary artery without angina pectoris: Secondary | ICD-10-CM | POA: Diagnosis not present

## 2016-01-20 DIAGNOSIS — J441 Chronic obstructive pulmonary disease with (acute) exacerbation: Secondary | ICD-10-CM | POA: Diagnosis not present

## 2016-01-20 DIAGNOSIS — E119 Type 2 diabetes mellitus without complications: Secondary | ICD-10-CM | POA: Diagnosis not present

## 2016-01-20 DIAGNOSIS — I10 Essential (primary) hypertension: Secondary | ICD-10-CM | POA: Diagnosis not present

## 2016-01-21 DIAGNOSIS — I2510 Atherosclerotic heart disease of native coronary artery without angina pectoris: Secondary | ICD-10-CM | POA: Diagnosis not present

## 2016-01-21 DIAGNOSIS — R110 Nausea: Secondary | ICD-10-CM | POA: Diagnosis not present

## 2016-01-21 DIAGNOSIS — F329 Major depressive disorder, single episode, unspecified: Secondary | ICD-10-CM | POA: Diagnosis not present

## 2016-01-21 DIAGNOSIS — R42 Dizziness and giddiness: Secondary | ICD-10-CM | POA: Diagnosis not present

## 2016-01-21 DIAGNOSIS — F0390 Unspecified dementia without behavioral disturbance: Secondary | ICD-10-CM | POA: Diagnosis not present

## 2016-01-21 DIAGNOSIS — K449 Diaphragmatic hernia without obstruction or gangrene: Secondary | ICD-10-CM | POA: Diagnosis not present

## 2016-01-21 DIAGNOSIS — J449 Chronic obstructive pulmonary disease, unspecified: Secondary | ICD-10-CM | POA: Diagnosis not present

## 2016-01-21 DIAGNOSIS — R531 Weakness: Secondary | ICD-10-CM | POA: Diagnosis not present

## 2016-01-21 DIAGNOSIS — E119 Type 2 diabetes mellitus without complications: Secondary | ICD-10-CM | POA: Diagnosis not present

## 2016-01-21 DIAGNOSIS — K219 Gastro-esophageal reflux disease without esophagitis: Secondary | ICD-10-CM | POA: Diagnosis not present

## 2016-01-21 DIAGNOSIS — I480 Paroxysmal atrial fibrillation: Secondary | ICD-10-CM | POA: Diagnosis not present

## 2016-01-24 ENCOUNTER — Institutional Professional Consult (permissible substitution): Payer: Commercial Managed Care - HMO | Admitting: Pulmonary Disease

## 2016-01-24 DIAGNOSIS — I2510 Atherosclerotic heart disease of native coronary artery without angina pectoris: Secondary | ICD-10-CM | POA: Diagnosis not present

## 2016-01-24 DIAGNOSIS — I10 Essential (primary) hypertension: Secondary | ICD-10-CM | POA: Diagnosis not present

## 2016-01-24 DIAGNOSIS — F0390 Unspecified dementia without behavioral disturbance: Secondary | ICD-10-CM | POA: Diagnosis not present

## 2016-01-24 DIAGNOSIS — E119 Type 2 diabetes mellitus without complications: Secondary | ICD-10-CM | POA: Diagnosis not present

## 2016-01-24 DIAGNOSIS — M1991 Primary osteoarthritis, unspecified site: Secondary | ICD-10-CM | POA: Diagnosis not present

## 2016-01-24 DIAGNOSIS — J441 Chronic obstructive pulmonary disease with (acute) exacerbation: Secondary | ICD-10-CM | POA: Diagnosis not present

## 2016-01-25 DIAGNOSIS — I8290 Acute embolism and thrombosis of unspecified vein: Secondary | ICD-10-CM | POA: Diagnosis not present

## 2016-01-25 DIAGNOSIS — S2242XA Multiple fractures of ribs, left side, initial encounter for closed fracture: Secondary | ICD-10-CM | POA: Diagnosis not present

## 2016-01-25 DIAGNOSIS — J449 Chronic obstructive pulmonary disease, unspecified: Secondary | ICD-10-CM | POA: Diagnosis not present

## 2016-01-25 DIAGNOSIS — J9611 Chronic respiratory failure with hypoxia: Secondary | ICD-10-CM | POA: Diagnosis not present

## 2016-01-26 ENCOUNTER — Ambulatory Visit: Payer: Commercial Managed Care - HMO | Admitting: Cardiovascular Disease

## 2016-01-30 DIAGNOSIS — F0390 Unspecified dementia without behavioral disturbance: Secondary | ICD-10-CM | POA: Diagnosis not present

## 2016-01-30 DIAGNOSIS — J441 Chronic obstructive pulmonary disease with (acute) exacerbation: Secondary | ICD-10-CM | POA: Diagnosis not present

## 2016-01-30 DIAGNOSIS — E119 Type 2 diabetes mellitus without complications: Secondary | ICD-10-CM | POA: Diagnosis not present

## 2016-01-30 DIAGNOSIS — I2510 Atherosclerotic heart disease of native coronary artery without angina pectoris: Secondary | ICD-10-CM | POA: Diagnosis not present

## 2016-01-30 DIAGNOSIS — M1991 Primary osteoarthritis, unspecified site: Secondary | ICD-10-CM | POA: Diagnosis not present

## 2016-01-30 DIAGNOSIS — I10 Essential (primary) hypertension: Secondary | ICD-10-CM | POA: Diagnosis not present

## 2016-02-01 DIAGNOSIS — F0390 Unspecified dementia without behavioral disturbance: Secondary | ICD-10-CM | POA: Diagnosis not present

## 2016-02-01 DIAGNOSIS — I10 Essential (primary) hypertension: Secondary | ICD-10-CM | POA: Diagnosis not present

## 2016-02-01 DIAGNOSIS — I2510 Atherosclerotic heart disease of native coronary artery without angina pectoris: Secondary | ICD-10-CM | POA: Diagnosis not present

## 2016-02-01 DIAGNOSIS — J441 Chronic obstructive pulmonary disease with (acute) exacerbation: Secondary | ICD-10-CM | POA: Diagnosis not present

## 2016-02-01 DIAGNOSIS — E119 Type 2 diabetes mellitus without complications: Secondary | ICD-10-CM | POA: Diagnosis not present

## 2016-02-01 DIAGNOSIS — M1991 Primary osteoarthritis, unspecified site: Secondary | ICD-10-CM | POA: Diagnosis not present

## 2016-02-02 DIAGNOSIS — Z86711 Personal history of pulmonary embolism: Secondary | ICD-10-CM | POA: Diagnosis not present

## 2016-02-02 DIAGNOSIS — I25119 Atherosclerotic heart disease of native coronary artery with unspecified angina pectoris: Secondary | ICD-10-CM | POA: Diagnosis not present

## 2016-02-02 DIAGNOSIS — R0602 Shortness of breath: Secondary | ICD-10-CM | POA: Diagnosis not present

## 2016-02-02 DIAGNOSIS — I480 Paroxysmal atrial fibrillation: Secondary | ICD-10-CM | POA: Diagnosis not present

## 2016-02-06 DIAGNOSIS — R55 Syncope and collapse: Secondary | ICD-10-CM | POA: Diagnosis not present

## 2016-02-06 DIAGNOSIS — E860 Dehydration: Secondary | ICD-10-CM | POA: Diagnosis not present

## 2016-02-06 DIAGNOSIS — I2510 Atherosclerotic heart disease of native coronary artery without angina pectoris: Secondary | ICD-10-CM | POA: Diagnosis not present

## 2016-02-06 DIAGNOSIS — R079 Chest pain, unspecified: Secondary | ICD-10-CM | POA: Diagnosis not present

## 2016-02-06 DIAGNOSIS — R918 Other nonspecific abnormal finding of lung field: Secondary | ICD-10-CM | POA: Diagnosis not present

## 2016-02-06 DIAGNOSIS — R0789 Other chest pain: Secondary | ICD-10-CM | POA: Diagnosis not present

## 2016-02-06 DIAGNOSIS — R197 Diarrhea, unspecified: Secondary | ICD-10-CM | POA: Diagnosis not present

## 2016-02-06 DIAGNOSIS — R5382 Chronic fatigue, unspecified: Secondary | ICD-10-CM | POA: Diagnosis not present

## 2016-02-06 DIAGNOSIS — Z720 Tobacco use: Secondary | ICD-10-CM | POA: Diagnosis not present

## 2016-02-06 DIAGNOSIS — Z9981 Dependence on supplemental oxygen: Secondary | ICD-10-CM | POA: Diagnosis not present

## 2016-02-06 DIAGNOSIS — K521 Toxic gastroenteritis and colitis: Secondary | ICD-10-CM | POA: Diagnosis not present

## 2016-02-06 DIAGNOSIS — Z86711 Personal history of pulmonary embolism: Secondary | ICD-10-CM | POA: Diagnosis not present

## 2016-02-06 DIAGNOSIS — I8290 Acute embolism and thrombosis of unspecified vein: Secondary | ICD-10-CM | POA: Diagnosis not present

## 2016-02-06 DIAGNOSIS — T383X5A Adverse effect of insulin and oral hypoglycemic [antidiabetic] drugs, initial encounter: Secondary | ICD-10-CM | POA: Diagnosis not present

## 2016-02-06 DIAGNOSIS — J449 Chronic obstructive pulmonary disease, unspecified: Secondary | ICD-10-CM | POA: Diagnosis not present

## 2016-02-06 DIAGNOSIS — I4891 Unspecified atrial fibrillation: Secondary | ICD-10-CM | POA: Diagnosis not present

## 2016-02-06 DIAGNOSIS — I499 Cardiac arrhythmia, unspecified: Secondary | ICD-10-CM | POA: Diagnosis not present

## 2016-02-06 DIAGNOSIS — I25119 Atherosclerotic heart disease of native coronary artery with unspecified angina pectoris: Secondary | ICD-10-CM | POA: Diagnosis not present

## 2016-02-06 DIAGNOSIS — I480 Paroxysmal atrial fibrillation: Secondary | ICD-10-CM | POA: Diagnosis not present

## 2016-02-06 DIAGNOSIS — R072 Precordial pain: Secondary | ICD-10-CM | POA: Diagnosis not present

## 2016-02-06 DIAGNOSIS — R531 Weakness: Secondary | ICD-10-CM | POA: Diagnosis not present

## 2016-02-06 DIAGNOSIS — F0390 Unspecified dementia without behavioral disturbance: Secondary | ICD-10-CM | POA: Diagnosis not present

## 2016-02-06 DIAGNOSIS — I6523 Occlusion and stenosis of bilateral carotid arteries: Secondary | ICD-10-CM | POA: Diagnosis not present

## 2016-02-06 DIAGNOSIS — J9611 Chronic respiratory failure with hypoxia: Secondary | ICD-10-CM | POA: Diagnosis not present

## 2016-02-06 DIAGNOSIS — S199XXA Unspecified injury of neck, initial encounter: Secondary | ICD-10-CM | POA: Diagnosis not present

## 2016-02-06 DIAGNOSIS — E0590 Thyrotoxicosis, unspecified without thyrotoxic crisis or storm: Secondary | ICD-10-CM | POA: Diagnosis not present

## 2016-02-06 DIAGNOSIS — Z9989 Dependence on other enabling machines and devices: Secondary | ICD-10-CM | POA: Diagnosis not present

## 2016-02-06 DIAGNOSIS — Z955 Presence of coronary angioplasty implant and graft: Secondary | ICD-10-CM | POA: Diagnosis not present

## 2016-02-06 DIAGNOSIS — R748 Abnormal levels of other serum enzymes: Secondary | ICD-10-CM | POA: Diagnosis not present

## 2016-02-06 DIAGNOSIS — J168 Pneumonia due to other specified infectious organisms: Secondary | ICD-10-CM | POA: Diagnosis not present

## 2016-02-06 DIAGNOSIS — K219 Gastro-esophageal reflux disease without esophagitis: Secondary | ICD-10-CM | POA: Diagnosis not present

## 2016-02-06 DIAGNOSIS — R938 Abnormal findings on diagnostic imaging of other specified body structures: Secondary | ICD-10-CM | POA: Diagnosis not present

## 2016-02-10 DIAGNOSIS — M1991 Primary osteoarthritis, unspecified site: Secondary | ICD-10-CM | POA: Diagnosis not present

## 2016-02-10 DIAGNOSIS — I2510 Atherosclerotic heart disease of native coronary artery without angina pectoris: Secondary | ICD-10-CM | POA: Diagnosis not present

## 2016-02-10 DIAGNOSIS — I10 Essential (primary) hypertension: Secondary | ICD-10-CM | POA: Diagnosis not present

## 2016-02-10 DIAGNOSIS — E119 Type 2 diabetes mellitus without complications: Secondary | ICD-10-CM | POA: Diagnosis not present

## 2016-02-10 DIAGNOSIS — F0390 Unspecified dementia without behavioral disturbance: Secondary | ICD-10-CM | POA: Diagnosis not present

## 2016-02-10 DIAGNOSIS — J441 Chronic obstructive pulmonary disease with (acute) exacerbation: Secondary | ICD-10-CM | POA: Diagnosis not present

## 2016-02-14 DIAGNOSIS — E119 Type 2 diabetes mellitus without complications: Secondary | ICD-10-CM | POA: Diagnosis not present

## 2016-02-14 DIAGNOSIS — I2510 Atherosclerotic heart disease of native coronary artery without angina pectoris: Secondary | ICD-10-CM | POA: Diagnosis not present

## 2016-02-14 DIAGNOSIS — M1991 Primary osteoarthritis, unspecified site: Secondary | ICD-10-CM | POA: Diagnosis not present

## 2016-02-14 DIAGNOSIS — I10 Essential (primary) hypertension: Secondary | ICD-10-CM | POA: Diagnosis not present

## 2016-02-14 DIAGNOSIS — F0390 Unspecified dementia without behavioral disturbance: Secondary | ICD-10-CM | POA: Diagnosis not present

## 2016-02-14 DIAGNOSIS — J441 Chronic obstructive pulmonary disease with (acute) exacerbation: Secondary | ICD-10-CM | POA: Diagnosis not present

## 2016-02-15 DIAGNOSIS — I10 Essential (primary) hypertension: Secondary | ICD-10-CM | POA: Diagnosis not present

## 2016-02-15 DIAGNOSIS — E119 Type 2 diabetes mellitus without complications: Secondary | ICD-10-CM | POA: Diagnosis not present

## 2016-02-15 DIAGNOSIS — I2699 Other pulmonary embolism without acute cor pulmonale: Secondary | ICD-10-CM | POA: Diagnosis not present

## 2016-02-15 DIAGNOSIS — S2242XD Multiple fractures of ribs, left side, subsequent encounter for fracture with routine healing: Secondary | ICD-10-CM | POA: Diagnosis not present

## 2016-02-15 DIAGNOSIS — J449 Chronic obstructive pulmonary disease, unspecified: Secondary | ICD-10-CM | POA: Diagnosis not present

## 2016-02-15 DIAGNOSIS — I82439 Acute embolism and thrombosis of unspecified popliteal vein: Secondary | ICD-10-CM | POA: Diagnosis not present

## 2016-02-23 DIAGNOSIS — E119 Type 2 diabetes mellitus without complications: Secondary | ICD-10-CM | POA: Diagnosis not present

## 2016-02-23 DIAGNOSIS — I2699 Other pulmonary embolism without acute cor pulmonale: Secondary | ICD-10-CM | POA: Diagnosis not present

## 2016-02-23 DIAGNOSIS — I10 Essential (primary) hypertension: Secondary | ICD-10-CM | POA: Diagnosis not present

## 2016-02-23 DIAGNOSIS — S2242XD Multiple fractures of ribs, left side, subsequent encounter for fracture with routine healing: Secondary | ICD-10-CM | POA: Diagnosis not present

## 2016-02-23 DIAGNOSIS — I82439 Acute embolism and thrombosis of unspecified popliteal vein: Secondary | ICD-10-CM | POA: Diagnosis not present

## 2016-02-23 DIAGNOSIS — J449 Chronic obstructive pulmonary disease, unspecified: Secondary | ICD-10-CM | POA: Diagnosis not present

## 2016-02-29 DIAGNOSIS — I2699 Other pulmonary embolism without acute cor pulmonale: Secondary | ICD-10-CM | POA: Diagnosis not present

## 2016-02-29 DIAGNOSIS — S2242XD Multiple fractures of ribs, left side, subsequent encounter for fracture with routine healing: Secondary | ICD-10-CM | POA: Diagnosis not present

## 2016-02-29 DIAGNOSIS — E119 Type 2 diabetes mellitus without complications: Secondary | ICD-10-CM | POA: Diagnosis not present

## 2016-02-29 DIAGNOSIS — I10 Essential (primary) hypertension: Secondary | ICD-10-CM | POA: Diagnosis not present

## 2016-02-29 DIAGNOSIS — I82439 Acute embolism and thrombosis of unspecified popliteal vein: Secondary | ICD-10-CM | POA: Diagnosis not present

## 2016-02-29 DIAGNOSIS — J449 Chronic obstructive pulmonary disease, unspecified: Secondary | ICD-10-CM | POA: Diagnosis not present

## 2016-03-06 DIAGNOSIS — I82439 Acute embolism and thrombosis of unspecified popliteal vein: Secondary | ICD-10-CM | POA: Diagnosis not present

## 2016-03-06 DIAGNOSIS — S2242XD Multiple fractures of ribs, left side, subsequent encounter for fracture with routine healing: Secondary | ICD-10-CM | POA: Diagnosis not present

## 2016-03-06 DIAGNOSIS — I2699 Other pulmonary embolism without acute cor pulmonale: Secondary | ICD-10-CM | POA: Diagnosis not present

## 2016-03-06 DIAGNOSIS — E119 Type 2 diabetes mellitus without complications: Secondary | ICD-10-CM | POA: Diagnosis not present

## 2016-03-06 DIAGNOSIS — I10 Essential (primary) hypertension: Secondary | ICD-10-CM | POA: Diagnosis not present

## 2016-03-06 DIAGNOSIS — J449 Chronic obstructive pulmonary disease, unspecified: Secondary | ICD-10-CM | POA: Diagnosis not present

## 2016-03-30 ENCOUNTER — Ambulatory Visit: Payer: Medicare HMO | Admitting: Cardiovascular Disease

## 2016-03-30 NOTE — Progress Notes (Deleted)
No chief complaint on file.  History of Present Illness: 70 yo female with history of CAD, HLD, COPD, GERD here today for cardiac follow up. She has been followed in the past by Dr. Daleen Squibb. She has a history of CAD, status post bare metal stent RCA in 1995. Last cardiac cath 02/2006: LAD 20-30%, circumflex 20% mid, RCA widely patent stent in the mid vessel, PDA 20-30%, EF 60%. Stress myoview 10/29/11 with fixed defect inferior wall c/w scar, no ischemia. I saw her 12/06/14 and she c/o one episode of severe chest pain. Her chest wall was painful to touch. She also noted palpitations occurring several days per week. She is still smoking 1/2 ppd. She has been off of ASA with recent issues with kidney stones. I arranged a stress myoview which showed an inferior fixed defect but no large areas of ischemia. Monitor with PVCs. Lopressor was started. Admitted to [facility] May 2017 with SOB, chest pain, LE edema and felt to have a COPD exacerbation and UTI. She was treated with antibiotics and steroids. She was seen in primary care the following week Lasix was added 20 mg daily for swelling. She has had good response to the Lasix but became dizzy. She was readmitted 01/06/16 after tripping over her oxygen tubing and sustaining rib fractures. She was read[facility] to [facility] 01/12/16 with chest pain and right LE swelling and was found to have right LE DVT and bilateral PE. She was discharged home on Xarelto.   She is here today for follow up. ***  Primary Care Physician: Blane Ohara, MD   Past Medical History:  Diagnosis Date  . Arthritis   . Chronic kidney disease   . COPD with emphysema (HCC)   . Coronary atherosclerosis of native coronary vessel cardiologist-  dr Clifton James   hx  MI 1995--  BMS X1 to RCA   . Dementia   . Diabetes mellitus without complication (HCC)   . Dyspnea on exertion   . GERD (gastroesophageal reflux disease)   . H/O hiatal hernia   . Hematuria   . History of kidney stones   .  History of MI (myocardial infarction)    1995 S/P  BMS to RCA  . Hyperlipidemia   . Hyperlipidemia 01/12/2016  . On home oxygen therapy    1L  . Productive cough   . Pulmonary emboli (HCC)    a. 12/2015 bilat PE-->Xarelto.  . Renal stone 04/2014  . Right ureteral stone   . RLS (restless legs syndrome)   . S/p bare metal coronary artery stent    1995 to  RCA  . Smokers' cough (HCC)   . Wears dentures    UPPER    Past Surgical History:  Procedure Laterality Date  . CARDIAC CATHETERIZATION  03-11-2006  dr Eden Emms   LAD  20-30%/  mCFX  20%/   RCA  with widely patent stent,  PDA  20-30%/  EF 60%  . CARDIOVASCULAR STRESS TEST  10-29-2011  DR MCALHANY   abnormal nuclear study with fixed moderate basal inferolateral wall defect suggestive of prior MI / mild hypokinesis/  no ischemia/  ef 63%  . CHOLECYSTECTOMY  yrs ago  . CORONARY ANGIOPLASTY WITH STENT PLACEMENT  1995      BMS X1  to mRCA  . CYSTOSCOPY WITH URETEROSCOPY AND STENT PLACEMENT Right 03/08/2014   Procedure: RIGHT URETEROSCOPY AND STENT PLACEMENT WITH DILATION OF INFANDIBULAR STENOSIS;  Surgeon: Garnett Farm, MD;  Location: [facility];  Service:  Urology;  Laterality: Right;  . EXTRACORPOREAL SHOCK WAVE LITHOTRIPSY Right 02-18-2014  . HOLMIUM LASER APPLICATION Right 03/08/2014   Procedure: HOLMIUM LASER LITHO;  Surgeon: Garnett Farm, MD;  Location: [facility];  Service: Urology;  Laterality: Right;  . THYROIDECTOMY, PARTIAL  age 80 (approx)   for goiter , benign  . TOTAL ABDOMINAL HYSTERECTOMY W/ BILATERAL SALPINGOOPHORECTOMY  age 38's    Current Outpatient Prescriptions  Medication Sig Dispense Refill  . acetaminophen (TYLENOL) 325 MG tablet Take 650 mg by mouth every 6 (six) hours.    Marland Kitchen albuterol (PROVENTIL) (2.5 MG/3ML) 0.083% nebulizer solution Take 2.5 mg by nebulization 4 (four) times daily.     Marland Kitchen atorvastatin (LIPITOR) 80 MG tablet TAKE 1 TABLET BY MOUTH EVERY DAY 30 tablet 11  .  budesonide-formoterol (SYMBICORT) 80-4.5 MCG/ACT inhaler Inhale 2 puffs into the lungs 2 (two) times daily.    . [employer] FIBER GUMMIES PO Take 1 tablet by mouth daily.    . cyclobenzaprine (FLEXERIL) 10 MG tablet Take 10 mg by mouth 2 (two) times daily as needed for muscle spasms.    Tery Sanfilippo Calcium (STOOL SOFTENER PO) Take 1 tablet by mouth daily.    . furosemide (LASIX) 20 MG tablet Take 1 tablet (20 mg total) by mouth daily as needed. 90 tablet 3  . gabapentin (NEURONTIN) 400 MG capsule Take 400 mg by mouth 3 (three) times daily.     . isosorbide mononitrate (IMDUR) 120 MG 24 hr tablet TAKE 1 TABLET BY MOUTH EVERY DAY 30 tablet 11  . lidocaine (LIDODERM) 5 % Place 1 patch onto the skin daily. Remove & Discard patch within 12 hours or as directed by MD 30 patch 1  . metFORMIN (GLUCOPHAGE) 500 MG tablet TAKE 1 DAILY IN THE MORNING  0  . methimazole (TAPAZOLE) 5 MG tablet Take 5 mg by mouth 2 (two) times daily.  0  . metoprolol tartrate (LOPRESSOR) 25 MG tablet Take 0.5 tablets (12.5 mg total) by mouth 2 (two) times daily. 30 tablet 6  . nicotine (NICODERM CQ - DOSED IN MG/24 HOURS) 21 mg/24hr patch Place 21 mg onto the skin daily.    . nitroGLYCERIN (NITROSTAT) 0.4 MG SL tablet Place 0.4 mg under the tongue every 5 (five) minutes as needed for chest pain (X3 DOSES BEFORE CALLING 911).    . NON FORMULARY Place 1 L/min into the nose continuous. OXYGEN ALL DAY    . nystatin ointment (MYCOSTATIN) Apply 1 application topically 2 (two) times daily.    Marland Kitchen omeprazole (PRILOSEC) 40 MG capsule Take 40 mg by mouth daily.  3  . ondansetron (ZOFRAN) 4 MG tablet Take 4 mg by mouth every 6 (six) hours as needed for nausea or vomiting.    Marland Kitchen oxyCODONE (OXY IR/ROXICODONE) 5 MG immediate release tablet Take 1 tablet (5 mg total) by mouth every 6 (six) hours as needed for severe pain. 30 tablet 0  . Rivaroxaban (XARELTO) 15 MG TABS tablet Take 1 tablet (15 mg total) by mouth 2 (two) times daily with a meal. x 18 more  days then start 20 mg daily on 7/22 (Rx provided). 35 tablet 0  . rivaroxaban (XARELTO) 20 MG TABS tablet Take 1 tablet (20 mg total) by mouth daily with supper. 30 tablet 3  . traMADol (ULTRAM) 50 MG tablet Take 1 tablet (50 mg total) by mouth every 6 (six) hours as needed for moderate pain. 30 tablet 0  . VENTOLIN HFA 108 (90 Base) MCG/ACT  inhaler Inhale 1-2 puffs into the lungs every 4 (four) hours as needed for wheezing or shortness of breath.      No current facility-administered medications for this visit.     Allergies  Allergen Reactions  . Avelox [Moxifloxacin Hcl In Nacl] Anaphylaxis  . Daliresp [Roflumilast] Other (See Comments)    tremors  . Fosamax [Alendronate Sodium] Nausea And Vomiting  . Lexapro [Escitalopram Oxalate] Diarrhea  . Contrast Media [Iodinated Diagnostic Agents] Rash    Verified with patient, she has not experienced shortness of breath with contrast. She had a mild site reaction at her IV during that time.    Social History   Social History  . Marital status: Widowed    Spouse name: N/A  . Number of children: 1  . Years of education: N/A   Occupational History  . DISABILITY Unemployed   Social History Main Topics  . Smoking status: Former Smoker    Packs/day: 0.50    Years: 56.00    Types: Cigarettes  . Smokeless tobacco: Never Used     Comment: pt using vapor cig  . Alcohol use No  . Drug use: No  . Sexual activity: Not on file   Other Topics Concern  . Not on file   Social History Narrative  . No narrative on file    Family History  Problem Relation Age of Onset  . Heart attack Father   . Heart attack Mother   . Thyroid disease Sister   . Kidney disease Brother   . Breast cancer Sister   . Crohn's disease Sister     Review of Systems:  As stated in the HPI and otherwise negative.   There were no vitals taken for this visit.  Physical Examination: General: Well developed, well nourished, NAD  HEENT: OP clear, mucus  membranes moist  SKIN: warm, dry. No rashes. Neuro: No focal deficits  Musculoskeletal: Muscle strength 5/5 all ext  Psychiatric: Mood and affect normal  Neck: No JVD, no carotid bruits, no thyromegaly, no lymphadenopathy.  Lungs:Clear bilaterally, no wheezes, rhonci, crackles Cardiovascular: Regular rate and rhythm. No murmurs, gallops or rubs. Abdomen:Soft. Bowel sounds present. Non-tender.  Extremities: No lower extremity edema. Pulses are 2 + in the bilateral DP/PT.  Echo 01/13/16: - Left ventricle: The cavity size was normal. Wall thickness was   normal. Systolic function was normal. The estimated ejection   fraction was in the range of 60% to 65%. Wall motion was normal;   there were no regional wall motion abnormalities. Doppler   parameters are consistent with abnormal left ventricular   relaxation (grade 1 diastolic dysfunction). - Right ventricle: Systolic function was moderately reduced. - Pulmonary arteries: Systolic pressure was moderately increased.   PA peak pressure: 60 mm Hg (S).  Stress myoview 12/27/14:  There is a large, severe, fixed defect involving the entire inferolateral, anterolateral, apical lateral and inferoapical walls. There is a small in size, mild fixed defect involving the basal and apical anteroseptal walls. There is no ischemia noted. This is an intermediate risk study.A prior study was conducted on 10/25/2011.  LV cavity size is normal. The left ventricular ejection fraction is mildly decreased (45-54%)  A prior study was conducted on 10/25/2011.Compared to the prior study, there are changes.The anterolateral fixed defect is new   EKG:  EKG is not *** ordered today. The ekg ordered today demonstrates   Recent Labs: 01/17/2016: BUN 9; Creatinine, Ser 0.73; Hemoglobin 13.1; Platelets 186; Potassium 5.5; Sodium 139  Lipid Panel Followed in primary care   Wt Readings from Last 3 Encounters:  01/17/16 129 lb 4.8 oz (58.7 kg)  12/19/15 135 lb 12.8  oz (61.6 kg)  09/19/15 140 lb 1.9 oz (63.6 kg)     Other studies Reviewed: Additional studies/ records that were reviewed today include: . Review of the above records demonstrates:    Assessment and Plan:   1. CAD: She has known CAD with remote stenting of the RCA. Last cath in 2007 with patent RCA stent and minimal plaque in the LAD and Circumflex. Stress myoview June 2016 without ischemia. She has no chest pain to suggest angina. I think her respiratory issues are mostly pulmonary related. She has COPD and is on home oxygen, still smoking.    2. Hyperlipidemia: Continue statin. Lipids well controlled per pt in primary care.   3. HTN: BP controlled today. No changes.   4. Palpitations/PVCs: Palpitations improved on Lopressor.   5. Tobacco abuse: *** Smoking cessation is advised. She does not wish to stop smoking.   6. COPD: See above. [facility] for COPD exacerbation. She is now on home O2. No improvement in dyspnea with lasix. Still smoking. I suspect that her symptoms are mostly related to her underlying lung disease. Will use Lasix prn for weight gain and swelling. Echo to assess LV function. Referral to pulmonary.   Current medicines are reviewed at length with the patient today.  The patient does not have concerns regarding medicines.  The following changes have been made:  no change  Labs/ tests ordered today include:   No orders of the defined types were placed in this encounter.   Disposition:   FU with me in 6 months  Signed, Verne Carrowhristopher McAlhany, MD 03/30/2016 5:44 AM    [facility] [address], [place], [place]  [address] Phone: [phone]; Fax: [phone]

## 2016-04-05 ENCOUNTER — Encounter: Payer: Self-pay | Admitting: Cardiovascular Disease

## 2016-07-24 DIAGNOSIS — E119 Type 2 diabetes mellitus without complications: Secondary | ICD-10-CM | POA: Diagnosis not present

## 2016-07-24 DIAGNOSIS — E038 Other specified hypothyroidism: Secondary | ICD-10-CM | POA: Diagnosis not present

## 2016-07-24 DIAGNOSIS — J441 Chronic obstructive pulmonary disease with (acute) exacerbation: Secondary | ICD-10-CM | POA: Diagnosis not present

## 2016-07-24 DIAGNOSIS — E034 Atrophy of thyroid (acquired): Secondary | ICD-10-CM | POA: Diagnosis not present

## 2016-07-24 DIAGNOSIS — I4891 Unspecified atrial fibrillation: Secondary | ICD-10-CM | POA: Diagnosis not present

## 2016-07-24 DIAGNOSIS — Z79899 Other long term (current) drug therapy: Secondary | ICD-10-CM | POA: Diagnosis not present

## 2016-08-06 DIAGNOSIS — R069 Unspecified abnormalities of breathing: Secondary | ICD-10-CM | POA: Diagnosis not present

## 2016-08-06 DIAGNOSIS — Z716 Tobacco abuse counseling: Secondary | ICD-10-CM | POA: Diagnosis not present

## 2016-08-06 DIAGNOSIS — R05 Cough: Secondary | ICD-10-CM | POA: Diagnosis not present

## 2016-08-06 DIAGNOSIS — J449 Chronic obstructive pulmonary disease, unspecified: Secondary | ICD-10-CM | POA: Diagnosis not present

## 2016-08-07 DIAGNOSIS — J9621 Acute and chronic respiratory failure with hypoxia: Secondary | ICD-10-CM | POA: Diagnosis not present

## 2016-08-07 DIAGNOSIS — Z716 Tobacco abuse counseling: Secondary | ICD-10-CM | POA: Diagnosis not present

## 2016-08-07 DIAGNOSIS — Z86718 Personal history of other venous thrombosis and embolism: Secondary | ICD-10-CM | POA: Diagnosis not present

## 2016-08-07 DIAGNOSIS — J449 Chronic obstructive pulmonary disease, unspecified: Secondary | ICD-10-CM | POA: Diagnosis not present

## 2016-08-07 DIAGNOSIS — G629 Polyneuropathy, unspecified: Secondary | ICD-10-CM | POA: Diagnosis not present

## 2016-08-07 DIAGNOSIS — I2510 Atherosclerotic heart disease of native coronary artery without angina pectoris: Secondary | ICD-10-CM | POA: Diagnosis not present

## 2016-08-07 DIAGNOSIS — E440 Moderate protein-calorie malnutrition: Secondary | ICD-10-CM | POA: Diagnosis not present

## 2016-08-07 DIAGNOSIS — J441 Chronic obstructive pulmonary disease with (acute) exacerbation: Secondary | ICD-10-CM | POA: Diagnosis not present

## 2016-08-07 DIAGNOSIS — R05 Cough: Secondary | ICD-10-CM | POA: Diagnosis not present

## 2016-08-07 DIAGNOSIS — J440 Chronic obstructive pulmonary disease with acute lower respiratory infection: Secondary | ICD-10-CM | POA: Diagnosis not present

## 2016-08-07 DIAGNOSIS — E119 Type 2 diabetes mellitus without complications: Secondary | ICD-10-CM | POA: Diagnosis not present

## 2016-08-07 DIAGNOSIS — J189 Pneumonia, unspecified organism: Secondary | ICD-10-CM | POA: Diagnosis not present

## 2016-08-07 DIAGNOSIS — J439 Emphysema, unspecified: Secondary | ICD-10-CM | POA: Diagnosis not present

## 2016-08-07 DIAGNOSIS — R0602 Shortness of breath: Secondary | ICD-10-CM | POA: Diagnosis not present

## 2016-08-07 DIAGNOSIS — R069 Unspecified abnormalities of breathing: Secondary | ICD-10-CM | POA: Diagnosis not present

## 2016-08-08 DIAGNOSIS — Z86718 Personal history of other venous thrombosis and embolism: Secondary | ICD-10-CM

## 2016-08-08 DIAGNOSIS — J189 Pneumonia, unspecified organism: Secondary | ICD-10-CM

## 2016-08-08 DIAGNOSIS — I2510 Atherosclerotic heart disease of native coronary artery without angina pectoris: Secondary | ICD-10-CM

## 2016-08-08 DIAGNOSIS — I10 Essential (primary) hypertension: Secondary | ICD-10-CM

## 2016-08-08 DIAGNOSIS — E876 Hypokalemia: Secondary | ICD-10-CM

## 2016-08-08 DIAGNOSIS — Z720 Tobacco use: Secondary | ICD-10-CM

## 2016-08-08 DIAGNOSIS — E785 Hyperlipidemia, unspecified: Secondary | ICD-10-CM

## 2016-08-08 DIAGNOSIS — G629 Polyneuropathy, unspecified: Secondary | ICD-10-CM

## 2016-08-08 DIAGNOSIS — J441 Chronic obstructive pulmonary disease with (acute) exacerbation: Secondary | ICD-10-CM

## 2016-08-12 DIAGNOSIS — J441 Chronic obstructive pulmonary disease with (acute) exacerbation: Secondary | ICD-10-CM

## 2016-08-12 DIAGNOSIS — G629 Polyneuropathy, unspecified: Secondary | ICD-10-CM | POA: Diagnosis not present

## 2016-08-12 DIAGNOSIS — I2510 Atherosclerotic heart disease of native coronary artery without angina pectoris: Secondary | ICD-10-CM | POA: Diagnosis not present

## 2016-08-12 DIAGNOSIS — Z86718 Personal history of other venous thrombosis and embolism: Secondary | ICD-10-CM | POA: Diagnosis not present

## 2016-08-12 DIAGNOSIS — E785 Hyperlipidemia, unspecified: Secondary | ICD-10-CM

## 2016-08-12 DIAGNOSIS — Z720 Tobacco use: Secondary | ICD-10-CM

## 2016-08-12 DIAGNOSIS — J189 Pneumonia, unspecified organism: Secondary | ICD-10-CM | POA: Diagnosis not present

## 2016-08-12 DIAGNOSIS — E876 Hypokalemia: Secondary | ICD-10-CM

## 2016-08-12 DIAGNOSIS — I10 Essential (primary) hypertension: Secondary | ICD-10-CM

## 2016-08-17 DIAGNOSIS — J441 Chronic obstructive pulmonary disease with (acute) exacerbation: Secondary | ICD-10-CM | POA: Diagnosis not present

## 2016-08-17 DIAGNOSIS — E119 Type 2 diabetes mellitus without complications: Secondary | ICD-10-CM | POA: Diagnosis not present

## 2016-08-17 DIAGNOSIS — R590 Localized enlarged lymph nodes: Secondary | ICD-10-CM | POA: Diagnosis not present

## 2016-08-23 DIAGNOSIS — Z959 Presence of cardiac and vascular implant and graft, unspecified: Secondary | ICD-10-CM | POA: Diagnosis not present

## 2016-08-23 DIAGNOSIS — R55 Syncope and collapse: Secondary | ICD-10-CM | POA: Diagnosis not present

## 2016-08-24 DIAGNOSIS — R600 Localized edema: Secondary | ICD-10-CM | POA: Diagnosis not present

## 2016-08-24 DIAGNOSIS — J441 Chronic obstructive pulmonary disease with (acute) exacerbation: Secondary | ICD-10-CM | POA: Diagnosis not present

## 2016-08-24 DIAGNOSIS — B370 Candidal stomatitis: Secondary | ICD-10-CM | POA: Diagnosis not present

## 2016-08-24 DIAGNOSIS — R0602 Shortness of breath: Secondary | ICD-10-CM | POA: Diagnosis not present

## 2016-08-24 DIAGNOSIS — R911 Solitary pulmonary nodule: Secondary | ICD-10-CM | POA: Diagnosis not present

## 2016-08-24 DIAGNOSIS — E119 Type 2 diabetes mellitus without complications: Secondary | ICD-10-CM | POA: Diagnosis not present

## 2016-08-28 DIAGNOSIS — I2583 Coronary atherosclerosis due to lipid rich plaque: Secondary | ICD-10-CM | POA: Diagnosis not present

## 2016-08-28 DIAGNOSIS — I2510 Atherosclerotic heart disease of native coronary artery without angina pectoris: Secondary | ICD-10-CM | POA: Diagnosis not present

## 2016-08-28 DIAGNOSIS — I479 Paroxysmal tachycardia, unspecified: Secondary | ICD-10-CM | POA: Diagnosis not present

## 2016-08-29 ENCOUNTER — Telehealth: Payer: Self-pay | Admitting: Pulmonary Disease

## 2016-08-29 DIAGNOSIS — L03115 Cellulitis of right lower limb: Secondary | ICD-10-CM | POA: Diagnosis not present

## 2016-08-30 NOTE — Telephone Encounter (Signed)
Nothing in this chart.  Will close message

## 2016-09-05 DIAGNOSIS — R599 Enlarged lymph nodes, unspecified: Secondary | ICD-10-CM | POA: Diagnosis not present

## 2016-09-05 DIAGNOSIS — N200 Calculus of kidney: Secondary | ICD-10-CM | POA: Diagnosis not present

## 2016-09-05 DIAGNOSIS — R590 Localized enlarged lymph nodes: Secondary | ICD-10-CM | POA: Diagnosis not present

## 2016-09-05 DIAGNOSIS — I700 Atherosclerosis of aorta: Secondary | ICD-10-CM | POA: Diagnosis not present

## 2016-09-05 DIAGNOSIS — I2510 Atherosclerotic heart disease of native coronary artery without angina pectoris: Secondary | ICD-10-CM | POA: Diagnosis not present

## 2016-09-11 DIAGNOSIS — J449 Chronic obstructive pulmonary disease, unspecified: Secondary | ICD-10-CM | POA: Diagnosis not present

## 2016-09-12 DIAGNOSIS — M25461 Effusion, right knee: Secondary | ICD-10-CM | POA: Diagnosis not present

## 2016-09-12 DIAGNOSIS — S3992XA Unspecified injury of lower back, initial encounter: Secondary | ICD-10-CM | POA: Diagnosis not present

## 2016-09-12 DIAGNOSIS — S8001XA Contusion of right knee, initial encounter: Secondary | ICD-10-CM | POA: Diagnosis not present

## 2016-09-12 DIAGNOSIS — L03115 Cellulitis of right lower limb: Secondary | ICD-10-CM | POA: Diagnosis not present

## 2016-09-12 DIAGNOSIS — S8992XA Unspecified injury of left lower leg, initial encounter: Secondary | ICD-10-CM | POA: Diagnosis not present

## 2016-09-12 DIAGNOSIS — M542 Cervicalgia: Secondary | ICD-10-CM | POA: Diagnosis not present

## 2016-09-12 DIAGNOSIS — S8002XA Contusion of left knee, initial encounter: Secondary | ICD-10-CM | POA: Diagnosis not present

## 2016-09-12 DIAGNOSIS — S161XXA Strain of muscle, fascia and tendon at neck level, initial encounter: Secondary | ICD-10-CM | POA: Diagnosis not present

## 2016-09-12 DIAGNOSIS — M25562 Pain in left knee: Secondary | ICD-10-CM | POA: Diagnosis not present

## 2016-09-12 DIAGNOSIS — M545 Low back pain: Secondary | ICD-10-CM | POA: Diagnosis not present

## 2016-09-18 DIAGNOSIS — Z90710 Acquired absence of both cervix and uterus: Secondary | ICD-10-CM | POA: Diagnosis not present

## 2016-09-18 DIAGNOSIS — I209 Angina pectoris, unspecified: Secondary | ICD-10-CM | POA: Diagnosis not present

## 2016-09-18 DIAGNOSIS — K219 Gastro-esophageal reflux disease without esophagitis: Secondary | ICD-10-CM | POA: Diagnosis not present

## 2016-09-18 DIAGNOSIS — E1151 Type 2 diabetes mellitus with diabetic peripheral angiopathy without gangrene: Secondary | ICD-10-CM | POA: Diagnosis not present

## 2016-09-18 DIAGNOSIS — Z7901 Long term (current) use of anticoagulants: Secondary | ICD-10-CM | POA: Diagnosis not present

## 2016-09-18 DIAGNOSIS — I10 Essential (primary) hypertension: Secondary | ICD-10-CM | POA: Diagnosis not present

## 2016-09-18 DIAGNOSIS — Z86711 Personal history of pulmonary embolism: Secondary | ICD-10-CM | POA: Diagnosis not present

## 2016-09-18 DIAGNOSIS — E785 Hyperlipidemia, unspecified: Secondary | ICD-10-CM | POA: Diagnosis not present

## 2016-09-18 DIAGNOSIS — Z7984 Long term (current) use of oral hypoglycemic drugs: Secondary | ICD-10-CM | POA: Diagnosis not present

## 2016-09-18 DIAGNOSIS — J449 Chronic obstructive pulmonary disease, unspecified: Secondary | ICD-10-CM | POA: Diagnosis not present

## 2016-09-18 DIAGNOSIS — E039 Hypothyroidism, unspecified: Secondary | ICD-10-CM | POA: Diagnosis not present

## 2016-09-18 DIAGNOSIS — L309 Dermatitis, unspecified: Secondary | ICD-10-CM | POA: Diagnosis not present

## 2016-09-18 DIAGNOSIS — I252 Old myocardial infarction: Secondary | ICD-10-CM | POA: Diagnosis not present

## 2016-09-19 DIAGNOSIS — E11622 Type 2 diabetes mellitus with other skin ulcer: Secondary | ICD-10-CM | POA: Diagnosis not present

## 2016-09-19 DIAGNOSIS — L97919 Non-pressure chronic ulcer of unspecified part of right lower leg with unspecified severity: Secondary | ICD-10-CM | POA: Diagnosis not present

## 2016-09-19 DIAGNOSIS — L97811 Non-pressure chronic ulcer of other part of right lower leg limited to breakdown of skin: Secondary | ICD-10-CM | POA: Diagnosis not present

## 2016-09-19 DIAGNOSIS — I4891 Unspecified atrial fibrillation: Secondary | ICD-10-CM | POA: Diagnosis not present

## 2016-09-19 DIAGNOSIS — Z86718 Personal history of other venous thrombosis and embolism: Secondary | ICD-10-CM | POA: Diagnosis not present

## 2016-09-19 DIAGNOSIS — J449 Chronic obstructive pulmonary disease, unspecified: Secondary | ICD-10-CM | POA: Diagnosis not present

## 2016-09-19 DIAGNOSIS — F17200 Nicotine dependence, unspecified, uncomplicated: Secondary | ICD-10-CM | POA: Diagnosis not present

## 2016-09-19 DIAGNOSIS — L97812 Non-pressure chronic ulcer of other part of right lower leg with fat layer exposed: Secondary | ICD-10-CM | POA: Diagnosis not present

## 2016-09-19 DIAGNOSIS — I739 Peripheral vascular disease, unspecified: Secondary | ICD-10-CM | POA: Diagnosis not present

## 2016-09-19 DIAGNOSIS — I252 Old myocardial infarction: Secondary | ICD-10-CM | POA: Diagnosis not present

## 2016-09-21 DIAGNOSIS — L97811 Non-pressure chronic ulcer of other part of right lower leg limited to breakdown of skin: Secondary | ICD-10-CM | POA: Diagnosis not present

## 2016-09-21 DIAGNOSIS — S134XXD Sprain of ligaments of cervical spine, subsequent encounter: Secondary | ICD-10-CM | POA: Diagnosis not present

## 2016-09-21 DIAGNOSIS — E11622 Type 2 diabetes mellitus with other skin ulcer: Secondary | ICD-10-CM | POA: Diagnosis not present

## 2016-09-21 DIAGNOSIS — J410 Simple chronic bronchitis: Secondary | ICD-10-CM | POA: Diagnosis not present

## 2016-09-21 DIAGNOSIS — I739 Peripheral vascular disease, unspecified: Secondary | ICD-10-CM | POA: Diagnosis not present

## 2016-09-21 DIAGNOSIS — R600 Localized edema: Secondary | ICD-10-CM | POA: Diagnosis not present

## 2016-09-26 ENCOUNTER — Institutional Professional Consult (permissible substitution): Payer: Medicare HMO | Admitting: Emergency Medicine

## 2016-09-26 DIAGNOSIS — E11622 Type 2 diabetes mellitus with other skin ulcer: Secondary | ICD-10-CM | POA: Diagnosis not present

## 2016-09-26 DIAGNOSIS — L97811 Non-pressure chronic ulcer of other part of right lower leg limited to breakdown of skin: Secondary | ICD-10-CM | POA: Diagnosis not present

## 2016-09-26 DIAGNOSIS — L97812 Non-pressure chronic ulcer of other part of right lower leg with fat layer exposed: Secondary | ICD-10-CM | POA: Diagnosis not present

## 2016-09-26 DIAGNOSIS — I872 Venous insufficiency (chronic) (peripheral): Secondary | ICD-10-CM | POA: Diagnosis not present

## 2016-09-28 DIAGNOSIS — M79604 Pain in right leg: Secondary | ICD-10-CM | POA: Diagnosis not present

## 2016-09-28 DIAGNOSIS — L97919 Non-pressure chronic ulcer of unspecified part of right lower leg with unspecified severity: Secondary | ICD-10-CM | POA: Diagnosis not present

## 2016-10-03 DIAGNOSIS — I872 Venous insufficiency (chronic) (peripheral): Secondary | ICD-10-CM | POA: Diagnosis not present

## 2016-10-03 DIAGNOSIS — E11622 Type 2 diabetes mellitus with other skin ulcer: Secondary | ICD-10-CM | POA: Diagnosis not present

## 2016-10-03 DIAGNOSIS — L97212 Non-pressure chronic ulcer of right calf with fat layer exposed: Secondary | ICD-10-CM | POA: Diagnosis not present

## 2016-10-03 DIAGNOSIS — L97811 Non-pressure chronic ulcer of other part of right lower leg limited to breakdown of skin: Secondary | ICD-10-CM | POA: Diagnosis not present

## 2016-10-09 DIAGNOSIS — I872 Venous insufficiency (chronic) (peripheral): Secondary | ICD-10-CM | POA: Diagnosis not present

## 2016-10-09 DIAGNOSIS — L97212 Non-pressure chronic ulcer of right calf with fat layer exposed: Secondary | ICD-10-CM | POA: Diagnosis not present

## 2016-10-09 DIAGNOSIS — E11622 Type 2 diabetes mellitus with other skin ulcer: Secondary | ICD-10-CM | POA: Diagnosis not present

## 2016-10-09 DIAGNOSIS — L97811 Non-pressure chronic ulcer of other part of right lower leg limited to breakdown of skin: Secondary | ICD-10-CM | POA: Diagnosis not present

## 2016-10-10 DIAGNOSIS — H02403 Unspecified ptosis of bilateral eyelids: Secondary | ICD-10-CM | POA: Diagnosis not present

## 2016-10-11 DIAGNOSIS — R600 Localized edema: Secondary | ICD-10-CM | POA: Diagnosis not present

## 2016-10-11 DIAGNOSIS — M79604 Pain in right leg: Secondary | ICD-10-CM | POA: Diagnosis not present

## 2016-10-14 DIAGNOSIS — M549 Dorsalgia, unspecified: Secondary | ICD-10-CM | POA: Diagnosis not present

## 2016-10-14 DIAGNOSIS — I482 Chronic atrial fibrillation: Secondary | ICD-10-CM | POA: Diagnosis not present

## 2016-10-14 DIAGNOSIS — I10 Essential (primary) hypertension: Secondary | ICD-10-CM | POA: Diagnosis not present

## 2016-10-14 DIAGNOSIS — K5730 Diverticulosis of large intestine without perforation or abscess without bleeding: Secondary | ICD-10-CM | POA: Diagnosis not present

## 2016-10-14 DIAGNOSIS — J441 Chronic obstructive pulmonary disease with (acute) exacerbation: Secondary | ICD-10-CM | POA: Diagnosis not present

## 2016-10-14 DIAGNOSIS — G629 Polyneuropathy, unspecified: Secondary | ICD-10-CM | POA: Diagnosis not present

## 2016-10-14 DIAGNOSIS — I252 Old myocardial infarction: Secondary | ICD-10-CM | POA: Diagnosis not present

## 2016-10-14 DIAGNOSIS — J449 Chronic obstructive pulmonary disease, unspecified: Secondary | ICD-10-CM | POA: Diagnosis not present

## 2016-10-14 DIAGNOSIS — J209 Acute bronchitis, unspecified: Secondary | ICD-10-CM | POA: Diagnosis not present

## 2016-10-14 DIAGNOSIS — J440 Chronic obstructive pulmonary disease with acute lower respiratory infection: Secondary | ICD-10-CM | POA: Diagnosis not present

## 2016-10-14 DIAGNOSIS — J9601 Acute respiratory failure with hypoxia: Secondary | ICD-10-CM | POA: Diagnosis not present

## 2016-10-14 DIAGNOSIS — F17210 Nicotine dependence, cigarettes, uncomplicated: Secondary | ICD-10-CM | POA: Diagnosis not present

## 2016-10-14 DIAGNOSIS — R269 Unspecified abnormalities of gait and mobility: Secondary | ICD-10-CM | POA: Diagnosis not present

## 2016-10-14 DIAGNOSIS — R079 Chest pain, unspecified: Secondary | ICD-10-CM | POA: Diagnosis not present

## 2016-10-17 DIAGNOSIS — I482 Chronic atrial fibrillation: Secondary | ICD-10-CM | POA: Diagnosis not present

## 2016-10-17 DIAGNOSIS — J9601 Acute respiratory failure with hypoxia: Secondary | ICD-10-CM | POA: Diagnosis not present

## 2016-10-17 DIAGNOSIS — J441 Chronic obstructive pulmonary disease with (acute) exacerbation: Secondary | ICD-10-CM | POA: Diagnosis not present

## 2016-10-17 DIAGNOSIS — M549 Dorsalgia, unspecified: Secondary | ICD-10-CM | POA: Diagnosis not present

## 2016-10-17 DIAGNOSIS — J209 Acute bronchitis, unspecified: Secondary | ICD-10-CM | POA: Diagnosis not present

## 2016-10-17 DIAGNOSIS — G629 Polyneuropathy, unspecified: Secondary | ICD-10-CM | POA: Diagnosis not present

## 2016-10-17 DIAGNOSIS — R079 Chest pain, unspecified: Secondary | ICD-10-CM | POA: Diagnosis not present

## 2016-10-18 DIAGNOSIS — R918 Other nonspecific abnormal finding of lung field: Secondary | ICD-10-CM | POA: Diagnosis not present

## 2016-10-18 DIAGNOSIS — R079 Chest pain, unspecified: Secondary | ICD-10-CM | POA: Diagnosis not present

## 2016-10-18 DIAGNOSIS — Z66 Do not resuscitate: Secondary | ICD-10-CM | POA: Diagnosis not present

## 2016-10-18 DIAGNOSIS — R748 Abnormal levels of other serum enzymes: Secondary | ICD-10-CM | POA: Diagnosis not present

## 2016-10-18 DIAGNOSIS — J9600 Acute respiratory failure, unspecified whether with hypoxia or hypercapnia: Secondary | ICD-10-CM | POA: Diagnosis not present

## 2016-10-18 DIAGNOSIS — I2510 Atherosclerotic heart disease of native coronary artery without angina pectoris: Secondary | ICD-10-CM | POA: Diagnosis not present

## 2016-10-18 DIAGNOSIS — I21A1 Myocardial infarction type 2: Secondary | ICD-10-CM | POA: Diagnosis not present

## 2016-10-18 DIAGNOSIS — F17210 Nicotine dependence, cigarettes, uncomplicated: Secondary | ICD-10-CM | POA: Diagnosis not present

## 2016-10-18 DIAGNOSIS — J441 Chronic obstructive pulmonary disease with (acute) exacerbation: Secondary | ICD-10-CM | POA: Diagnosis not present

## 2016-10-18 DIAGNOSIS — Z955 Presence of coronary angioplasty implant and graft: Secondary | ICD-10-CM | POA: Diagnosis not present

## 2016-10-18 DIAGNOSIS — L97212 Non-pressure chronic ulcer of right calf with fat layer exposed: Secondary | ICD-10-CM | POA: Diagnosis not present

## 2016-10-18 DIAGNOSIS — I214 Non-ST elevation (NSTEMI) myocardial infarction: Secondary | ICD-10-CM | POA: Diagnosis not present

## 2016-10-18 DIAGNOSIS — J9601 Acute respiratory failure with hypoxia: Secondary | ICD-10-CM | POA: Diagnosis not present

## 2016-10-18 DIAGNOSIS — I4892 Unspecified atrial flutter: Secondary | ICD-10-CM | POA: Diagnosis not present

## 2016-10-18 DIAGNOSIS — E11622 Type 2 diabetes mellitus with other skin ulcer: Secondary | ICD-10-CM | POA: Diagnosis not present

## 2016-10-18 DIAGNOSIS — J9690 Respiratory failure, unspecified, unspecified whether with hypoxia or hypercapnia: Secondary | ICD-10-CM | POA: Diagnosis not present

## 2016-10-18 DIAGNOSIS — J9621 Acute and chronic respiratory failure with hypoxia: Secondary | ICD-10-CM | POA: Diagnosis not present

## 2016-10-18 DIAGNOSIS — Z7901 Long term (current) use of anticoagulants: Secondary | ICD-10-CM | POA: Diagnosis not present

## 2016-10-18 DIAGNOSIS — I119 Hypertensive heart disease without heart failure: Secondary | ICD-10-CM | POA: Diagnosis not present

## 2016-10-18 DIAGNOSIS — J9602 Acute respiratory failure with hypercapnia: Secondary | ICD-10-CM | POA: Diagnosis not present

## 2016-10-18 DIAGNOSIS — I480 Paroxysmal atrial fibrillation: Secondary | ICD-10-CM | POA: Diagnosis not present

## 2016-10-18 DIAGNOSIS — E784 Other hyperlipidemia: Secondary | ICD-10-CM | POA: Diagnosis not present

## 2016-10-18 DIAGNOSIS — E870 Hyperosmolality and hypernatremia: Secondary | ICD-10-CM | POA: Diagnosis not present

## 2016-10-18 DIAGNOSIS — Z9981 Dependence on supplemental oxygen: Secondary | ICD-10-CM | POA: Diagnosis not present

## 2016-10-18 DIAGNOSIS — E876 Hypokalemia: Secondary | ICD-10-CM | POA: Diagnosis not present

## 2016-10-18 DIAGNOSIS — Z95818 Presence of other cardiac implants and grafts: Secondary | ICD-10-CM | POA: Diagnosis not present

## 2016-10-18 DIAGNOSIS — I252 Old myocardial infarction: Secondary | ICD-10-CM | POA: Diagnosis not present

## 2016-10-18 DIAGNOSIS — J9622 Acute and chronic respiratory failure with hypercapnia: Secondary | ICD-10-CM | POA: Diagnosis not present

## 2016-10-18 DIAGNOSIS — G9340 Encephalopathy, unspecified: Secondary | ICD-10-CM | POA: Diagnosis not present

## 2016-10-23 DIAGNOSIS — I480 Paroxysmal atrial fibrillation: Secondary | ICD-10-CM

## 2016-10-23 DIAGNOSIS — R748 Abnormal levels of other serum enzymes: Secondary | ICD-10-CM

## 2016-10-23 DIAGNOSIS — J9601 Acute respiratory failure with hypoxia: Secondary | ICD-10-CM | POA: Diagnosis not present

## 2016-10-30 DIAGNOSIS — I2583 Coronary atherosclerosis due to lipid rich plaque: Secondary | ICD-10-CM | POA: Diagnosis not present

## 2016-10-30 DIAGNOSIS — J449 Chronic obstructive pulmonary disease, unspecified: Secondary | ICD-10-CM | POA: Diagnosis not present

## 2016-10-30 DIAGNOSIS — I2510 Atherosclerotic heart disease of native coronary artery without angina pectoris: Secondary | ICD-10-CM | POA: Diagnosis not present

## 2016-10-30 DIAGNOSIS — I479 Paroxysmal tachycardia, unspecified: Secondary | ICD-10-CM | POA: Diagnosis not present

## 2016-10-30 DIAGNOSIS — I480 Paroxysmal atrial fibrillation: Secondary | ICD-10-CM | POA: Diagnosis not present

## 2016-10-30 DIAGNOSIS — Z955 Presence of coronary angioplasty implant and graft: Secondary | ICD-10-CM | POA: Diagnosis not present

## 2016-10-31 DIAGNOSIS — F0390 Unspecified dementia without behavioral disturbance: Secondary | ICD-10-CM | POA: Diagnosis not present

## 2016-10-31 DIAGNOSIS — E119 Type 2 diabetes mellitus without complications: Secondary | ICD-10-CM | POA: Diagnosis not present

## 2016-10-31 DIAGNOSIS — I214 Non-ST elevation (NSTEMI) myocardial infarction: Secondary | ICD-10-CM | POA: Diagnosis not present

## 2016-10-31 DIAGNOSIS — J440 Chronic obstructive pulmonary disease with acute lower respiratory infection: Secondary | ICD-10-CM | POA: Diagnosis not present

## 2016-10-31 DIAGNOSIS — J9612 Chronic respiratory failure with hypercapnia: Secondary | ICD-10-CM | POA: Diagnosis not present

## 2016-10-31 DIAGNOSIS — J441 Chronic obstructive pulmonary disease with (acute) exacerbation: Secondary | ICD-10-CM | POA: Diagnosis not present

## 2016-10-31 DIAGNOSIS — I480 Paroxysmal atrial fibrillation: Secondary | ICD-10-CM | POA: Diagnosis not present

## 2016-10-31 DIAGNOSIS — J9611 Chronic respiratory failure with hypoxia: Secondary | ICD-10-CM | POA: Diagnosis not present

## 2016-10-31 DIAGNOSIS — J209 Acute bronchitis, unspecified: Secondary | ICD-10-CM | POA: Diagnosis not present

## 2016-11-01 DIAGNOSIS — J441 Chronic obstructive pulmonary disease with (acute) exacerbation: Secondary | ICD-10-CM | POA: Diagnosis not present

## 2016-11-01 DIAGNOSIS — J209 Acute bronchitis, unspecified: Secondary | ICD-10-CM | POA: Diagnosis not present

## 2016-11-01 DIAGNOSIS — J440 Chronic obstructive pulmonary disease with acute lower respiratory infection: Secondary | ICD-10-CM | POA: Diagnosis not present

## 2016-11-01 DIAGNOSIS — E119 Type 2 diabetes mellitus without complications: Secondary | ICD-10-CM | POA: Diagnosis not present

## 2016-11-01 DIAGNOSIS — I214 Non-ST elevation (NSTEMI) myocardial infarction: Secondary | ICD-10-CM | POA: Diagnosis not present

## 2016-11-01 DIAGNOSIS — F0390 Unspecified dementia without behavioral disturbance: Secondary | ICD-10-CM | POA: Diagnosis not present

## 2016-11-01 DIAGNOSIS — I480 Paroxysmal atrial fibrillation: Secondary | ICD-10-CM | POA: Diagnosis not present

## 2016-11-01 DIAGNOSIS — J9611 Chronic respiratory failure with hypoxia: Secondary | ICD-10-CM | POA: Diagnosis not present

## 2016-11-01 DIAGNOSIS — J9612 Chronic respiratory failure with hypercapnia: Secondary | ICD-10-CM | POA: Diagnosis not present

## 2016-11-02 DIAGNOSIS — J209 Acute bronchitis, unspecified: Secondary | ICD-10-CM | POA: Diagnosis not present

## 2016-11-02 DIAGNOSIS — E119 Type 2 diabetes mellitus without complications: Secondary | ICD-10-CM | POA: Diagnosis not present

## 2016-11-02 DIAGNOSIS — J440 Chronic obstructive pulmonary disease with acute lower respiratory infection: Secondary | ICD-10-CM | POA: Diagnosis not present

## 2016-11-02 DIAGNOSIS — J9611 Chronic respiratory failure with hypoxia: Secondary | ICD-10-CM | POA: Diagnosis not present

## 2016-11-02 DIAGNOSIS — I480 Paroxysmal atrial fibrillation: Secondary | ICD-10-CM | POA: Diagnosis not present

## 2016-11-02 DIAGNOSIS — J9612 Chronic respiratory failure with hypercapnia: Secondary | ICD-10-CM | POA: Diagnosis not present

## 2016-11-02 DIAGNOSIS — F0390 Unspecified dementia without behavioral disturbance: Secondary | ICD-10-CM | POA: Diagnosis not present

## 2016-11-02 DIAGNOSIS — J441 Chronic obstructive pulmonary disease with (acute) exacerbation: Secondary | ICD-10-CM | POA: Diagnosis not present

## 2016-11-02 DIAGNOSIS — I214 Non-ST elevation (NSTEMI) myocardial infarction: Secondary | ICD-10-CM | POA: Diagnosis not present

## 2016-11-05 DIAGNOSIS — K219 Gastro-esophageal reflux disease without esophagitis: Secondary | ICD-10-CM | POA: Diagnosis not present

## 2016-11-05 DIAGNOSIS — J418 Mixed simple and mucopurulent chronic bronchitis: Secondary | ICD-10-CM | POA: Diagnosis not present

## 2016-11-05 DIAGNOSIS — I7389 Other specified peripheral vascular diseases: Secondary | ICD-10-CM | POA: Diagnosis not present

## 2016-11-05 DIAGNOSIS — I480 Paroxysmal atrial fibrillation: Secondary | ICD-10-CM | POA: Diagnosis not present

## 2016-11-05 DIAGNOSIS — T65222D Toxic effect of tobacco cigarettes, intentional self-harm, subsequent encounter: Secondary | ICD-10-CM | POA: Diagnosis not present

## 2016-11-05 DIAGNOSIS — R600 Localized edema: Secondary | ICD-10-CM | POA: Diagnosis not present

## 2016-11-05 DIAGNOSIS — L89152 Pressure ulcer of sacral region, stage 2: Secondary | ICD-10-CM | POA: Diagnosis not present

## 2016-11-05 DIAGNOSIS — J9601 Acute respiratory failure with hypoxia: Secondary | ICD-10-CM | POA: Diagnosis not present

## 2016-11-05 DIAGNOSIS — E1142 Type 2 diabetes mellitus with diabetic polyneuropathy: Secondary | ICD-10-CM | POA: Diagnosis not present

## 2016-11-08 DIAGNOSIS — R5383 Other fatigue: Secondary | ICD-10-CM | POA: Diagnosis not present

## 2016-11-08 DIAGNOSIS — F17210 Nicotine dependence, cigarettes, uncomplicated: Secondary | ICD-10-CM | POA: Diagnosis not present

## 2016-11-08 DIAGNOSIS — J449 Chronic obstructive pulmonary disease, unspecified: Secondary | ICD-10-CM | POA: Diagnosis not present

## 2016-11-13 DIAGNOSIS — I214 Non-ST elevation (NSTEMI) myocardial infarction: Secondary | ICD-10-CM | POA: Diagnosis not present

## 2016-11-13 DIAGNOSIS — J209 Acute bronchitis, unspecified: Secondary | ICD-10-CM | POA: Diagnosis not present

## 2016-11-13 DIAGNOSIS — J440 Chronic obstructive pulmonary disease with acute lower respiratory infection: Secondary | ICD-10-CM | POA: Diagnosis not present

## 2016-11-13 DIAGNOSIS — J441 Chronic obstructive pulmonary disease with (acute) exacerbation: Secondary | ICD-10-CM | POA: Diagnosis not present

## 2016-11-20 DIAGNOSIS — Z8639 Personal history of other endocrine, nutritional and metabolic disease: Secondary | ICD-10-CM | POA: Diagnosis not present

## 2016-11-20 DIAGNOSIS — Z8679 Personal history of other diseases of the circulatory system: Secondary | ICD-10-CM | POA: Diagnosis not present

## 2016-11-20 DIAGNOSIS — Z86711 Personal history of pulmonary embolism: Secondary | ICD-10-CM | POA: Diagnosis not present

## 2016-11-20 DIAGNOSIS — J449 Chronic obstructive pulmonary disease, unspecified: Secondary | ICD-10-CM | POA: Diagnosis not present

## 2016-11-20 DIAGNOSIS — Z8709 Personal history of other diseases of the respiratory system: Secondary | ICD-10-CM | POA: Diagnosis not present

## 2016-11-20 DIAGNOSIS — I252 Old myocardial infarction: Secondary | ICD-10-CM | POA: Diagnosis not present

## 2016-11-20 DIAGNOSIS — I4891 Unspecified atrial fibrillation: Secondary | ICD-10-CM | POA: Diagnosis not present

## 2016-11-20 DIAGNOSIS — Z8701 Personal history of pneumonia (recurrent): Secondary | ICD-10-CM | POA: Diagnosis not present

## 2016-11-20 DIAGNOSIS — I2510 Atherosclerotic heart disease of native coronary artery without angina pectoris: Secondary | ICD-10-CM | POA: Diagnosis not present

## 2016-11-27 DIAGNOSIS — Z86718 Personal history of other venous thrombosis and embolism: Secondary | ICD-10-CM | POA: Diagnosis not present

## 2016-11-27 DIAGNOSIS — J449 Chronic obstructive pulmonary disease, unspecified: Secondary | ICD-10-CM | POA: Diagnosis not present

## 2016-11-27 DIAGNOSIS — F17200 Nicotine dependence, unspecified, uncomplicated: Secondary | ICD-10-CM | POA: Diagnosis not present

## 2016-11-27 DIAGNOSIS — L97312 Non-pressure chronic ulcer of right ankle with fat layer exposed: Secondary | ICD-10-CM | POA: Diagnosis not present

## 2016-11-27 DIAGNOSIS — M1990 Unspecified osteoarthritis, unspecified site: Secondary | ICD-10-CM | POA: Diagnosis not present

## 2016-11-27 DIAGNOSIS — L89312 Pressure ulcer of right buttock, stage 2: Secondary | ICD-10-CM | POA: Diagnosis not present

## 2016-11-27 DIAGNOSIS — I4891 Unspecified atrial fibrillation: Secondary | ICD-10-CM | POA: Diagnosis not present

## 2016-11-27 DIAGNOSIS — E11628 Type 2 diabetes mellitus with other skin complications: Secondary | ICD-10-CM | POA: Diagnosis not present

## 2016-11-27 DIAGNOSIS — I252 Old myocardial infarction: Secondary | ICD-10-CM | POA: Diagnosis not present

## 2016-11-27 DIAGNOSIS — G629 Polyneuropathy, unspecified: Secondary | ICD-10-CM | POA: Diagnosis not present

## 2016-11-28 DIAGNOSIS — Z79899 Other long term (current) drug therapy: Secondary | ICD-10-CM | POA: Diagnosis not present

## 2016-11-28 DIAGNOSIS — R0902 Hypoxemia: Secondary | ICD-10-CM | POA: Diagnosis not present

## 2016-11-28 DIAGNOSIS — R600 Localized edema: Secondary | ICD-10-CM | POA: Diagnosis not present

## 2016-11-28 DIAGNOSIS — E0580 Other thyrotoxicosis without thyrotoxic crisis or storm: Secondary | ICD-10-CM | POA: Diagnosis not present

## 2016-11-28 DIAGNOSIS — E782 Mixed hyperlipidemia: Secondary | ICD-10-CM | POA: Diagnosis not present

## 2016-11-28 DIAGNOSIS — E1142 Type 2 diabetes mellitus with diabetic polyneuropathy: Secondary | ICD-10-CM | POA: Diagnosis not present

## 2016-11-28 DIAGNOSIS — J441 Chronic obstructive pulmonary disease with (acute) exacerbation: Secondary | ICD-10-CM | POA: Diagnosis not present

## 2016-12-18 DIAGNOSIS — R21 Rash and other nonspecific skin eruption: Secondary | ICD-10-CM | POA: Diagnosis not present

## 2016-12-18 DIAGNOSIS — L89319 Pressure ulcer of right buttock, unspecified stage: Secondary | ICD-10-CM | POA: Diagnosis not present

## 2017-01-22 DIAGNOSIS — I252 Old myocardial infarction: Secondary | ICD-10-CM | POA: Diagnosis not present

## 2017-01-22 DIAGNOSIS — L97822 Non-pressure chronic ulcer of other part of left lower leg with fat layer exposed: Secondary | ICD-10-CM | POA: Diagnosis not present

## 2017-01-22 DIAGNOSIS — M1990 Unspecified osteoarthritis, unspecified site: Secondary | ICD-10-CM | POA: Diagnosis not present

## 2017-01-22 DIAGNOSIS — F17200 Nicotine dependence, unspecified, uncomplicated: Secondary | ICD-10-CM | POA: Diagnosis not present

## 2017-01-22 DIAGNOSIS — I87312 Chronic venous hypertension (idiopathic) with ulcer of left lower extremity: Secondary | ICD-10-CM | POA: Diagnosis not present

## 2017-01-22 DIAGNOSIS — Z86718 Personal history of other venous thrombosis and embolism: Secondary | ICD-10-CM | POA: Diagnosis not present

## 2017-01-22 DIAGNOSIS — I872 Venous insufficiency (chronic) (peripheral): Secondary | ICD-10-CM | POA: Diagnosis not present

## 2017-01-22 DIAGNOSIS — L97222 Non-pressure chronic ulcer of left calf with fat layer exposed: Secondary | ICD-10-CM | POA: Diagnosis not present

## 2017-01-22 DIAGNOSIS — I4891 Unspecified atrial fibrillation: Secondary | ICD-10-CM | POA: Diagnosis not present

## 2017-01-22 DIAGNOSIS — E11622 Type 2 diabetes mellitus with other skin ulcer: Secondary | ICD-10-CM | POA: Diagnosis not present

## 2017-01-22 DIAGNOSIS — J449 Chronic obstructive pulmonary disease, unspecified: Secondary | ICD-10-CM | POA: Diagnosis not present

## 2017-02-14 DIAGNOSIS — Z8679 Personal history of other diseases of the circulatory system: Secondary | ICD-10-CM | POA: Diagnosis not present

## 2017-02-14 DIAGNOSIS — I4891 Unspecified atrial fibrillation: Secondary | ICD-10-CM | POA: Diagnosis not present

## 2017-02-14 DIAGNOSIS — I2510 Atherosclerotic heart disease of native coronary artery without angina pectoris: Secondary | ICD-10-CM | POA: Diagnosis not present

## 2017-02-14 DIAGNOSIS — J449 Chronic obstructive pulmonary disease, unspecified: Secondary | ICD-10-CM | POA: Diagnosis not present

## 2017-02-14 DIAGNOSIS — Z8709 Personal history of other diseases of the respiratory system: Secondary | ICD-10-CM | POA: Diagnosis not present

## 2017-02-14 DIAGNOSIS — I252 Old myocardial infarction: Secondary | ICD-10-CM | POA: Diagnosis not present

## 2017-02-14 DIAGNOSIS — Z86711 Personal history of pulmonary embolism: Secondary | ICD-10-CM | POA: Diagnosis not present

## 2017-02-14 DIAGNOSIS — Z8639 Personal history of other endocrine, nutritional and metabolic disease: Secondary | ICD-10-CM | POA: Diagnosis not present

## 2017-02-14 DIAGNOSIS — Z8701 Personal history of pneumonia (recurrent): Secondary | ICD-10-CM | POA: Diagnosis not present

## 2017-03-16 DEATH — deceased

## 2017-05-29 IMAGING — CT CT CHEST W/ CM
2 of 3 series · 15 of 36 positions shown, 18 images · IV contrast (Omni 300)
Comparison: Chest x-ray December 05, 2015

CLINICAL DATA: Chest pain and shortness of breath.

EXAM:
CT CHEST WITH CONTRAST
TECHNIQUE: Multidetector CT imaging of the chest was performed during
intravenous contrast administration.
CONTRAST:  75 cc of Isovue 370

[Series 2: chest with 2mm st · axial · 0.72mm/px · z∈[+370,+656]mm · 12 of 169 slices shown, 15 images]
[im 13/169  mediastinal]
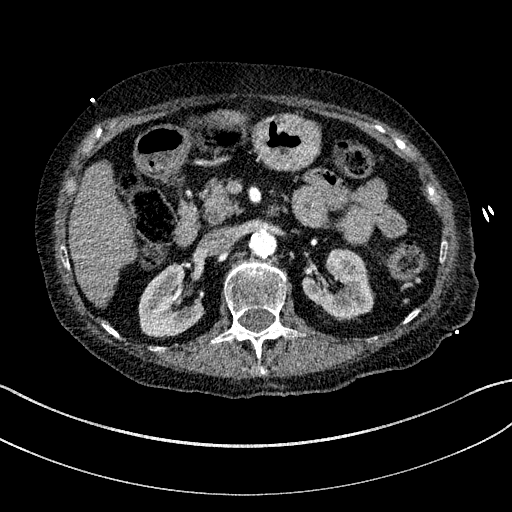
[im 13/169  lung]
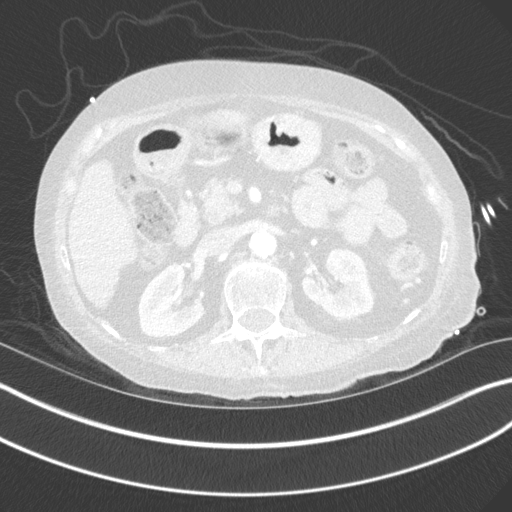
[im 25/169  lung]
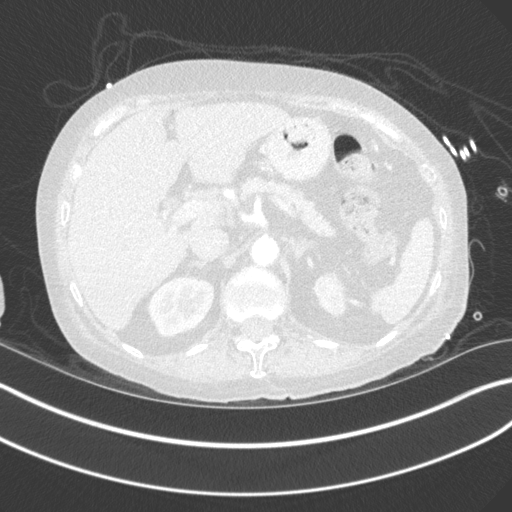
[im 38/169  lung]
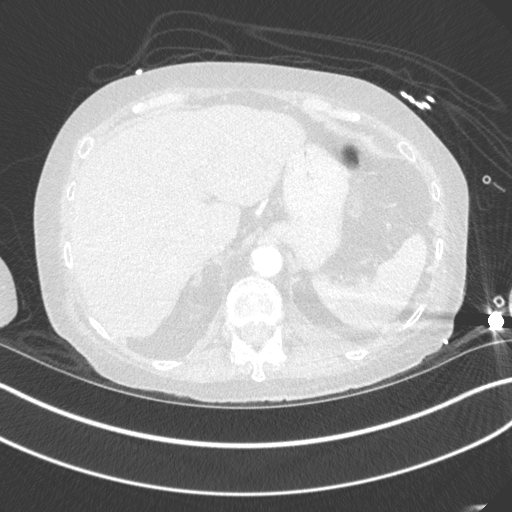
[im 50/169  lung]
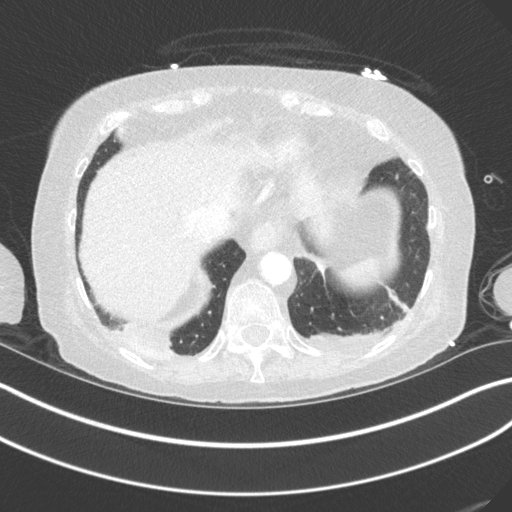
[im 63/169  mediastinal]
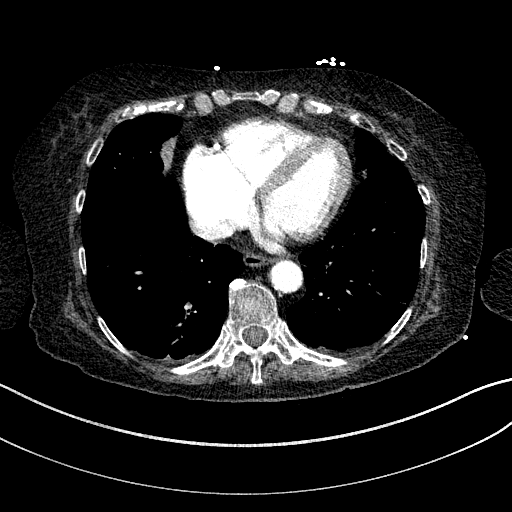
[im 63/169  lung]
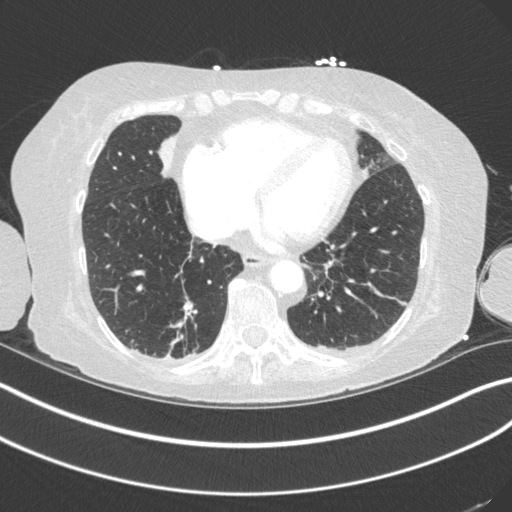
[im 75/169  lung]
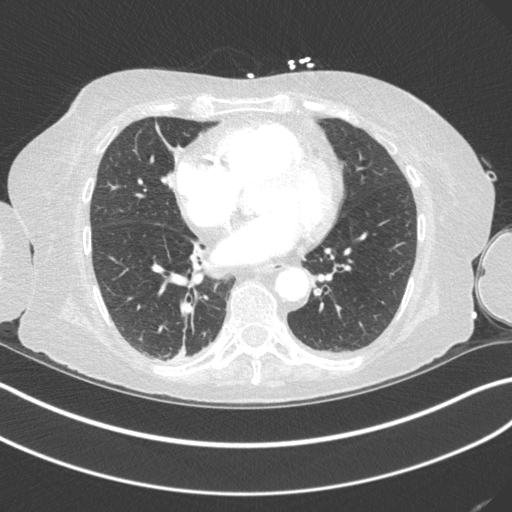
[im 94/169  lung]
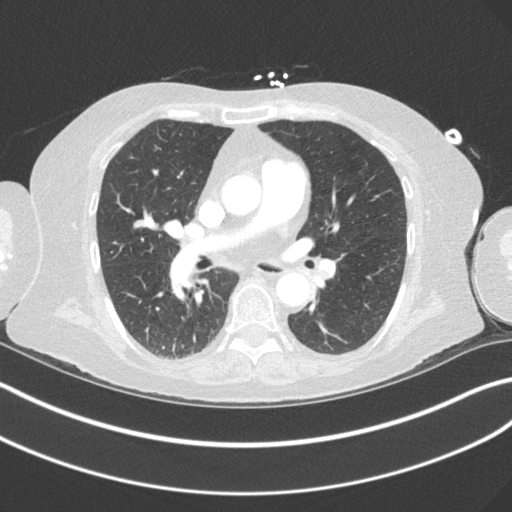
[im 106/169  lung]
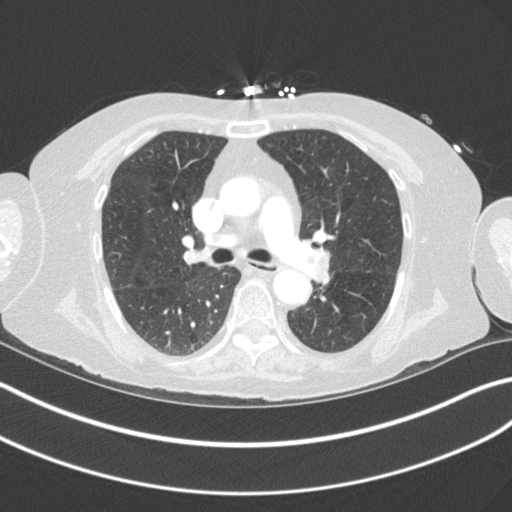
[im 119/169  mediastinal]
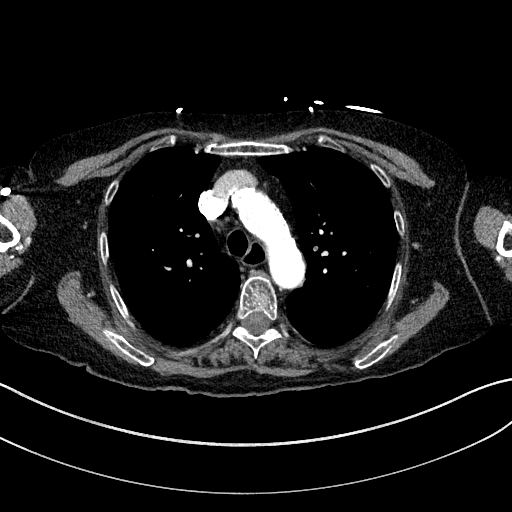
[im 119/169  lung]
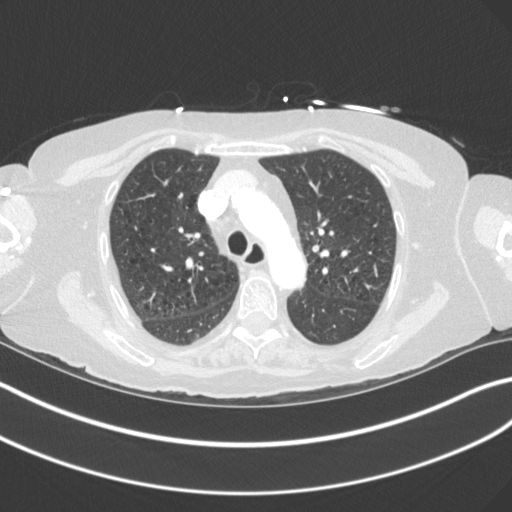
[im 131/169  lung]
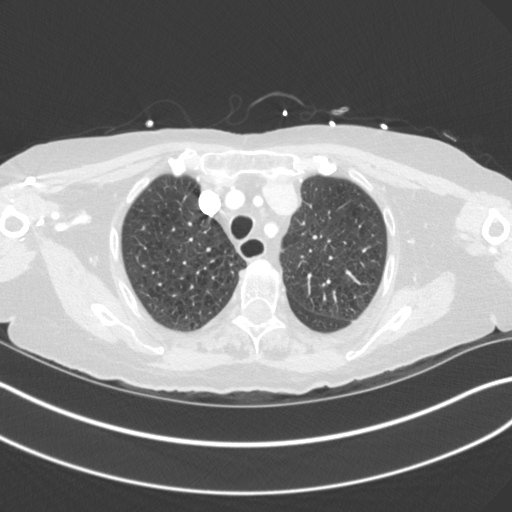
[im 144/169  lung]
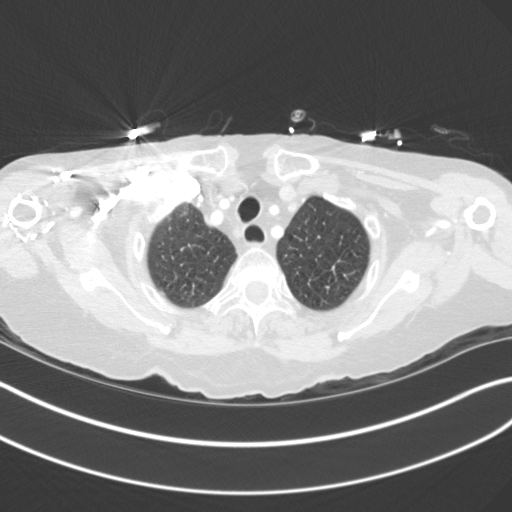
[im 156/169  lung]
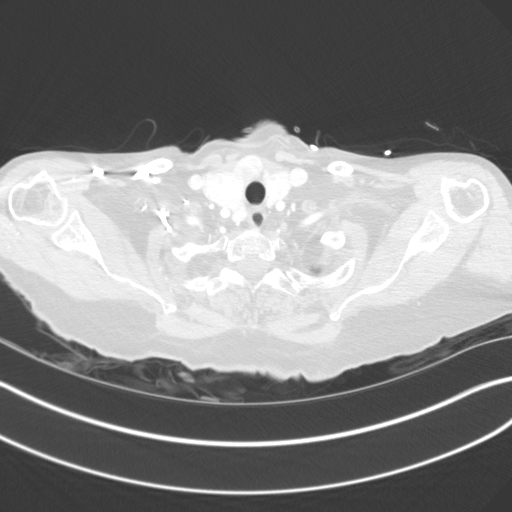

[Series 5: chest with 3mm st cor · coronal · 0.66mm/px · 3 of 75 slices shown]
[im 15/75  lung]
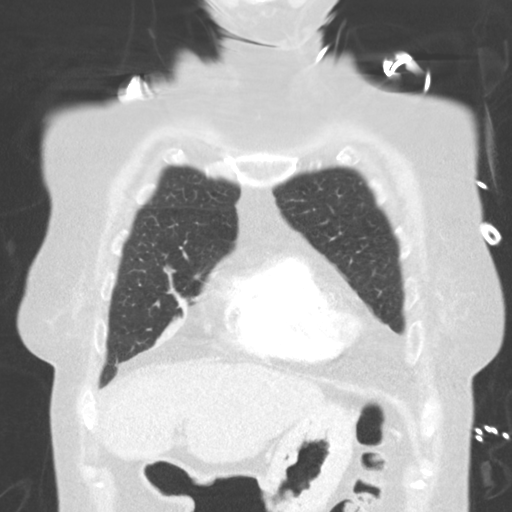
[im 30/75  lung]
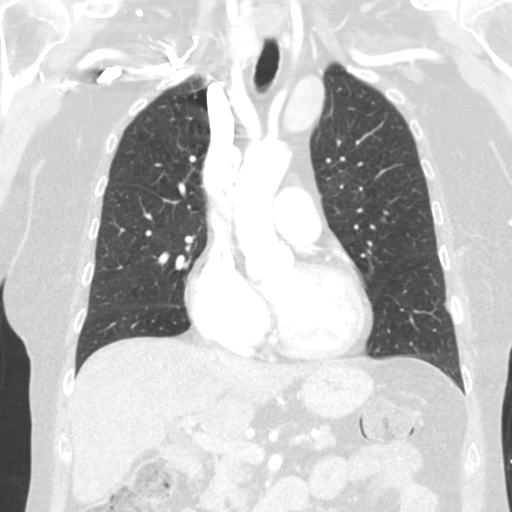
[im 45/75  lung]
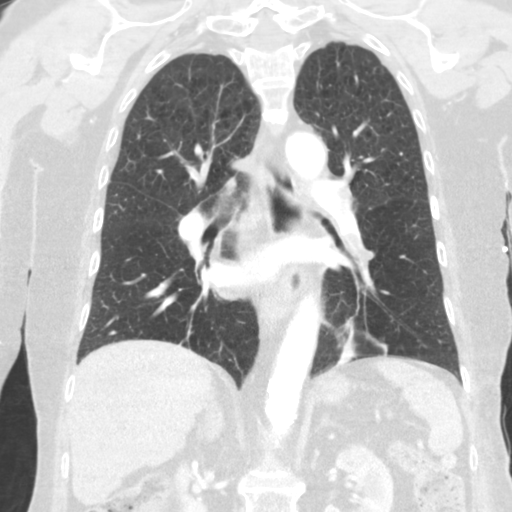

[15 of 36 positions shown; findings below may reference images not displayed]

FINDINGS: Mucus is seen within the trachea. Central airways are otherwise
normal. No pneumothorax. Emphysematous changes are seen in the
lungs, particularly in the apices. There is atelectasis in the bases
and right middle lobe. No suspicious pulmonary nodules, masses, or
focal infiltrates. There is a right thyroid goiter with enlargement.
Numerous nodules are seen in the thyroid. A chunky calcification is
seen in the right side of the thyroid. No adenopathy. Bilateral
pulmonary emboli are identified with involvement of all lobes and
thrombus extending into the right greater than left main pulmonary
arteries. The thoracic aorta is atherosclerotic but non aneurysmal.
No dissection. Coronary artery calcifications are noted. The heart
size is normal. Small bilateral effusions and atelectasis are seen.
Evaluation of the upper abdomen is limited. There is atherosclerotic
change in the abdominal aorta. No other acute abnormalities.

Mild degenerative changes in the spine.
IMPRESSION: 1. Bilateral pulmonary emboli are identified involving all lobes
with thrombus extending into the right greater than left pulmonary
arteries.
2. Emphysema.
3. Goiter.
Findings discussed with the patient's nurse, Revaldio Bulgin. She is
relaying the findings to the attending physician.
